# Patient Record
Sex: Female | Born: 1965 | Race: White | Hispanic: No | Smoking: Never smoker
Health system: Southern US, Community
[De-identification: ages and names within clinical notes are randomized; demographics above are authoritative.]

## PROBLEM LIST (undated history)

## (undated) DIAGNOSIS — E079 Disorder of thyroid, unspecified: Secondary | ICD-10-CM

## (undated) DIAGNOSIS — K589 Irritable bowel syndrome without diarrhea: Secondary | ICD-10-CM

## (undated) DIAGNOSIS — T7840XA Allergy, unspecified, initial encounter: Secondary | ICD-10-CM

## (undated) DIAGNOSIS — H348122 Central retinal vein occlusion, left eye, stable: Secondary | ICD-10-CM

## (undated) DIAGNOSIS — N809 Endometriosis, unspecified: Secondary | ICD-10-CM

## (undated) HISTORY — PX: WISDOM TOOTH EXTRACTION: SHX21

## (undated) HISTORY — DX: Disorder of thyroid, unspecified: E07.9

## (undated) HISTORY — DX: Central retinal vein occlusion, left eye, stable: H34.8122

## (undated) HISTORY — DX: Endometriosis, unspecified: N80.9

## (undated) HISTORY — DX: Allergy, unspecified, initial encounter: T78.40XA

## (undated) HISTORY — DX: Irritable bowel syndrome, unspecified: K58.9

---

## 1996-04-05 HISTORY — PX: CERVICAL BIOPSY  W/ LOOP ELECTRODE EXCISION: SUR135

## 1999-08-03 ENCOUNTER — Other Ambulatory Visit: Admission: RE | Admit: 1999-08-03 | Discharge: 1999-08-03 | Payer: Self-pay | Admitting: Gynecology

## 2000-10-03 HISTORY — PX: PELVIC LAPAROSCOPY: SHX162

## 2001-05-09 ENCOUNTER — Other Ambulatory Visit: Admission: RE | Admit: 2001-05-09 | Discharge: 2001-05-09 | Payer: Self-pay | Admitting: Gynecology

## 2002-11-30 ENCOUNTER — Other Ambulatory Visit: Admission: RE | Admit: 2002-11-30 | Discharge: 2002-11-30 | Payer: Self-pay | Admitting: Gynecology

## 2004-06-12 ENCOUNTER — Other Ambulatory Visit: Admission: RE | Admit: 2004-06-12 | Discharge: 2004-06-12 | Payer: Self-pay | Admitting: Gynecology

## 2006-10-12 ENCOUNTER — Other Ambulatory Visit: Admission: RE | Admit: 2006-10-12 | Discharge: 2006-10-12 | Payer: Self-pay | Admitting: Gynecology

## 2006-11-07 ENCOUNTER — Ambulatory Visit (HOSPITAL_BASED_OUTPATIENT_CLINIC_OR_DEPARTMENT_OTHER): Admission: RE | Admit: 2006-11-07 | Discharge: 2006-11-07 | Payer: Self-pay | Admitting: Gynecology

## 2008-05-22 ENCOUNTER — Encounter: Payer: Self-pay | Admitting: Gynecology

## 2008-05-22 ENCOUNTER — Ambulatory Visit: Payer: Self-pay | Admitting: Gynecology

## 2008-05-22 ENCOUNTER — Other Ambulatory Visit: Admission: RE | Admit: 2008-05-22 | Discharge: 2008-05-22 | Payer: Self-pay | Admitting: Gynecology

## 2008-06-11 ENCOUNTER — Encounter: Payer: Self-pay | Admitting: Gynecology

## 2008-06-11 ENCOUNTER — Ambulatory Visit: Payer: Self-pay | Admitting: Gynecology

## 2008-06-17 ENCOUNTER — Ambulatory Visit: Payer: Self-pay | Admitting: Gynecology

## 2008-10-01 ENCOUNTER — Ambulatory Visit: Payer: Self-pay | Admitting: Gynecology

## 2008-10-03 HISTORY — PX: OTHER SURGICAL HISTORY: SHX169

## 2008-10-14 ENCOUNTER — Ambulatory Visit: Payer: Self-pay | Admitting: Gynecology

## 2008-10-18 ENCOUNTER — Ambulatory Visit: Payer: Self-pay | Admitting: Gynecology

## 2008-10-18 ENCOUNTER — Encounter: Payer: Self-pay | Admitting: Gynecology

## 2008-10-18 ENCOUNTER — Ambulatory Visit (HOSPITAL_BASED_OUTPATIENT_CLINIC_OR_DEPARTMENT_OTHER): Admission: RE | Admit: 2008-10-18 | Discharge: 2008-10-19 | Payer: Self-pay | Admitting: Gynecology

## 2008-10-24 ENCOUNTER — Ambulatory Visit: Payer: Self-pay | Admitting: Gynecology

## 2008-11-12 ENCOUNTER — Ambulatory Visit: Payer: Self-pay | Admitting: Gynecology

## 2008-11-26 ENCOUNTER — Ambulatory Visit: Payer: Self-pay | Admitting: Gynecology

## 2009-09-24 ENCOUNTER — Ambulatory Visit: Payer: Self-pay | Admitting: Gynecology

## 2009-09-24 ENCOUNTER — Other Ambulatory Visit: Admission: RE | Admit: 2009-09-24 | Discharge: 2009-09-24 | Payer: Self-pay | Admitting: Gynecology

## 2010-07-12 LAB — CBC
Hemoglobin: 10.4 g/dL — ABNORMAL LOW (ref 12.0–15.0)
MCHC: 33.8 g/dL (ref 30.0–36.0)
MCV: 89.2 fL (ref 78.0–100.0)
RBC: 3.47 MIL/uL — ABNORMAL LOW (ref 3.87–5.11)
WBC: 14.4 10*3/uL — ABNORMAL HIGH (ref 4.0–10.5)

## 2010-07-12 LAB — DIFFERENTIAL
Basophils Relative: 0 % (ref 0–1)
Eosinophils Absolute: 0 10*3/uL (ref 0.0–0.7)
Lymphs Abs: 0.8 10*3/uL (ref 0.7–4.0)
Monocytes Absolute: 0.9 10*3/uL (ref 0.1–1.0)
Monocytes Relative: 6 % (ref 3–12)
Neutro Abs: 12.7 10*3/uL — ABNORMAL HIGH (ref 1.7–7.7)

## 2010-08-18 NOTE — Discharge Summary (Signed)
NAME:  Ashley Arellano, Ashley Arellano                   ACCOUNT NO.:  [id number]   MEDICAL RECORD NO.:  [id number]          PATIENT TYPE:  AMB   LOCATION:  [facility]                         FACILITY:  [facility]   PHYSICIAN:  Timothy P. Fontaine, M.D.DATE OF BIRTH:  1965/12/05   DATE OF ADMISSION:  10/18/2008  DATE OF DISCHARGE:  10/19/2008                               DISCHARGE SUMMARY   DISCHARGE DIAGNOSES:  Menorrhagia, leiomyoma.   PROCEDURE:  Total laparoscopic hysterectomy on October 18, 2008.  Pathology  [facility] [phone] - benign ectocervical and endocervical mucosa, benign  proliferative endometrium, benign myometrium and two intramural  leiomyoma.   HOSPITAL COURSE:  A 45 year old female with worsening menorrhagia.  Ultrasound suggestive of endometrial polyp.  Desired permanent  sterilization for hysterectomy.  The patient underwent an uncomplicated  total laparoscopic hysterectomy on October 18, 2008.  Her postoperative  course, it was noted the a.m. following surgery she was having  postsurgical discomfort, and it was difficult to determine how much was  related to the hysterectomy versus the sling procedure.  After oral pain  medication and ambulation, her pain improved, and ultimately she was  discharged home taking p.o. with adequate pain relief on oral pain  medication.  Her postoperative hemoglobin was stable from her  preoperative value, and she was given postoperative instructions to  include a.s.a.p. call precautions.  She was given a prescription for  pain to include Tylox #25 one to two p.o. q.4-6h. p.r.n. pain.  Her  postoperative bladder care is per the instructions from urology and her  follow up with them for any bladder issues was discussed.  She will  follow up with GYN in 2 weeks following discharge.  Patient was  contacted after discharge and reported feeling much better.      Timothy P. Fontaine, M.D.  Electronically Signed     TPF/MEDQ  D:  10/22/2008  T:  10/22/2008  Job:  [id number]

## 2010-08-18 NOTE — Op Note (Signed)
NAME:  Ashley Arellano, Ashley Arellano                   ACCOUNT NO.:  [id number]   MEDICAL RECORD NO.:  [id number]          PATIENT TYPE:  AMB   LOCATION:  [facility]                         FACILITY:  [facility]   PHYSICIAN:  Timothy P. Fontaine, M.D.DATE OF BIRTH:  02-22-1966   DATE OF PROCEDURE:  10/18/2008  DATE OF DISCHARGE:                               OPERATIVE REPORT   [facility].   PREOPERATIVE DIAGNOSES:  1. Menorrhagia.  2. Leiomyoma.  3. Endometrial polyp.   POSTOPERATIVE DIAGNOSES:  1. Menorrhagia.  2. Leiomyoma.  3. Endometrial polyp.   PROCEDURE:  Total laparoscopic hysterectomy.   SURGEON:  T. Audie Box, M.D.   Threasa HeadsLivia Snellen, M.D.   ANESTHETIC:  General.   ESTIMATED BLOOD LOSS:  100 mL.   COMPLICATIONS:  None.   SPECIMEN:  Uterus to Pathology.   FINDINGS:  EUA:  External, BUS and vagina normal.  Cervix normal,  bimanual uterus grossly normal size, midline and mobile.  Adnexa without  masses.  Laparoscopic:  Anterior cul-de-sac normal.  Posterior cul-de-  sac normal.  Uterus mildly enlarged, midline, mobile, smooth, right and  left ovaries grossly normal free and mobile.  Right and left fallopian  tube segments grossly normal, free and mobile, normal length and  caliber, fimbriated ends.  No evidence of pelvic adhesive disease or  endometriosis.  Upper abdominal exam is grossly normal with no gross  abnormalities or adhesive disease noted.   PROCEDURE:  The patient was taken to the operating room, underwent  general anesthesia, placed in the low dorsal lithotomy position and  received an abdominal perineal vaginal preparation with Betadine scrub  solution.  EUA performed.  Bladder emptied with indwelling Foley  catheter placed in sterile technique.  The cervix was visualized,  grasped with a single-tooth tenaculum, sounded and subsequently a Humi  uterine manipulator with KOH cup and vaginal occlusive insufflator were  all placed without difficulty.  The  uterine catheter was inflated and  noted to be secure.  The patient was then draped in the usual fashion.  A repeat infraumbilical incision was made using the 10-mm OptiVu direct  and trocar.  The abdomen was directly entered under direct visualization  without difficulty and subsequently insufflated.  Right and left 5-mm  suprapubic ports were then placed without difficulty after  transillumination for the vessels under direct visualization.  Examination of the pelvic organs and upper abdominal exam was carried  out with findings noted above.  Using the harmonic scalpel, the left  uterine ovarian pedicle was identified and transected without  difficulty.  The broad ligament peritoneal reflection was then  transected to the level of the round ligament using the harmonic scalpel  and subsequently the round ligament was transected.  The uterine vessel  was then skeletonized, coagulated with the harmonic scalpel with several  passes before transection.  The anterior vesicouterine peritoneal  reflection was then transected to the midline using the harmonic  scalpel.  A similar procedure was then carried out on the other side.  The anterior vesicle uterine plane was then developed with pressure on  the uterine manipulator.  The KOH cup was palpated and the bladder flap  was below this.  Subsequently, using the active blade of the harmonic  scalpel, the posterior peritoneal reflection between the uterosacral  ligaments at the intersection with the uterus was transected until the  blue cup was identified and it is was opened from uterosacral ligament  to uterosacral ligament.  The anterior vaginal mucosa was then  identified again under traction of the KOH cup.  Again, the bladder was  away from the site and, using the active blade, the vaginal mucosa was  transected until the blue of the KOH cup was visualized.  Through  progressive transection, again, using the active blade, the paravaginal   tissues were scored and ultimately transected to completely free the  uterine specimen from the patient.  During this process, the vaginal  insufflator was inflated to maintain the pneumoperitoneum and then  ultimately after freeing of the uterus this was drawn into the vagina  and maintained a pneumoperitoneum.  The pelvis was irrigated.  Hemostasis was visualized using the Endo stitch suturing device.  The  right and left vaginal angle sutures were placed to incorporate the  peritoneum and the vaginal mucosa and then tied using the knot pusher.  The vaginal cuff was then closed through progressive figure-of-eight  sutures again using the Endo stitch incorporating the peritoneum and the  vaginal mucosa and submucosal stroma with care to identify the bladder  and avoid stitches in this area.  The cuff was completely closed and the  pelvis copiously irrigated.  The pressure was slowly allowed to escape.  Hemostasis was visualized and subsequently the right and left suprapubic  ports were removed under direct visualization showing adequate  hemostasis.  Of note, after the freeing of the uterine specimen, the  left 5-mm port was replaced with a 10 mm port to allow the use of the  Endo stitch through this port.  The gas was slowly allowed to escape and  then the 10 mm infraumbilical port was then withdrawn again showing  adequate hemostasis and no evidence of hernia formation.  Zero Vicryl  interrupted subcutaneous fascial stitches were placed in both 10-mm port  sites, 0.25% Marcaine was injected in all skin incisions and all skin  incisions were reapproximated using 4-0 plain suture in interrupted  cutaneous stitch.  At this point, Dr. Roselee Nova assumed care of  the patient and proceeded with his sling urologic procedure.      Timothy P. Fontaine, M.D.  Electronically Signed     TPF/MEDQ  D:  10/18/2008  T:  10/19/2008  Job:  [id number]

## 2010-08-18 NOTE — Op Note (Signed)
NAME:  Ashley Arellano, Ashley Arellano                   ACCOUNT NO.:  [id number]   MEDICAL RECORD NO.:  [id number]          PATIENT TYPE:  AMB   LOCATION:  [facility]                         FACILITY:  [facility]   PHYSICIAN:  Timothy P. Fontaine, M.D.DATE OF BIRTH:  06-11-1965   DATE OF PROCEDURE:  11/07/2006  DATE OF DISCHARGE:                               OPERATIVE REPORT   PREOPERATIVE DIAGNOSES:  Pelvic pain.   POSTOPERATIVE DIAGNOSES:  Right ovarian adhesion.   PROCEDURE:  Diagnostic laparoscopy, lysis of pelvic adhesions.   SURGEON:  Timothy P. Fontaine, M.D.   ANESTHESIA:  General.   ESTIMATED BLOOD LOSS:  Minimal.   COMPLICATIONS:  None.   SPECIMEN:  None.   FINDINGS:  Anterior cul-de-sac normal, posterior cul-de-sac normal,  uterus grossly normal in size, shape and contour, significantly  retroverted, retroflexed into the posterior cul-de-sac.  Right and left  fallopian tubes normal length, caliber, fimbriated ends.  Left ovary  grossly normal free and mobile.  Right ovary adherent to the right  pelvic sidewall, bluntly lysed.  Subsequently freed and grossly normal  in appearance.  No evidence of pelvic endometriosis.  Upper abdominal  exam is normal noting liver smooth, no abnormalities.  Gallbladder  grossly normal to limited inspection, appendix retrocecal not entirely  visualized but distal portion visualized and normal appearance.  EUA  external B U S vagina normal.  Cervix grossly normal.  Bimanual: uterus  retroverted, adnexa without masses.   PROCEDURE:  The patient was taken to the operating room, underwent  general endotracheal anesthesia was placed low dorsal lithotomy  position, received abdominal perineal vaginal preparation Betadine  solution per nursing personnel.  Bladder emptied with in-and-out Foley  catheterization.  EUA was performed.  Initial attempts at placement of  Hulka tenaculum were unsuccessful due to the significant retroversion  and subsequently a Kohen cannula  was placed for uterine manipulation.  The patient was draped in usual fashion.  A vertical infraumbilical  incision was made.  The OptiVu 10 mm laparoscopic port was placed within  the abdomen under direct visualization without difficulty.  The abdomen  was insufflated and subsequently a 5-mm suprapubic midline port was  placed under direct visualization after transillumination for the  vessels.  Examination of pelvic organs, upper abdominal exam was carried  out with noted findings noted above.  On initial entry and inspection of  the pelvis.  The right ovary was found to be adherent to the right  pelvic sidewall.  Through blunt and manipulation to elevate the ovary.  The adhesive band was lysed and the ovary was freed.  That point,  inspection showed a normal pelvis throughout, careful posterior cul-de-  sac inspection showed no evidence of endometriosis.  There was no  bleeding at the lysis point.  This point the suprapubic port was  removed.  The gas slowly allowed to escape.  Adequate hemostasis  visualized under low pressure situation. The infraumbilical port was  then backed out under direct visualization showing adequate hemostasis.  No evidence of hernia formation.  All skin incisions were injected using  0.25% of Marcaine.  A 0-0 Vicryl infraumbilical subcutaneous fascial  stitch was placed.  The suprapubic port closed with Dermabond skin  adhesive.  A small bleeding site at the infraumbilical port was closed  using 4-0 Vicryl in interrupted cutaneous stitch and the remainder of  the skin incision closed using Dermabond skin adhesive.  The Kohen  cannula tenaculum were removed from the cervix.  The patient placed in  the supine position, awakened without difficulty, taken to recovery room  in good condition having tolerated the procedure well.      Timothy P. Fontaine, M.D.  Electronically Signed     TPF/MEDQ  D:  11/07/2006  T:  11/07/2006  Job:  [id number]

## 2010-08-18 NOTE — H&P (Signed)
NAME:  Ashley Arellano, Ashley Arellano                   ACCOUNT NO.:  [id number]   MEDICAL RECORD NO.:  [id number]          PATIENT TYPE:  AMB   LOCATION:  [facility]                         FACILITY:  [facility]   PHYSICIAN:  Timothy P. Fontaine, M.D.DATE OF BIRTH:  [birth year]   DATE OF ADMISSION:  10/18/2008  DATE OF DISCHARGE:                              HISTORY & PHYSICAL   CHIEF COMPLAINT:  Menorrhagia, leiomyoma, endometrial polyp, desires  permanent sterilization, history of stress urinary incontinence.   HISTORY OF PRESENT ILLNESS:  A 45 year old G2, P2 female, history of  worsening menorrhagia, stress urinary incontinence, underwent outpatient  evaluation to include a benign endometrial biopsy and a sonohystogram  which showed multiple small myomas, 2 echogenic defects within the  cavity suggestive of polyps, and an outpatient evaluation by urology  which was consistent with stress urinary incontinence.  Patient is  admitted at this time for definitive treatment and surgery to include  laparoscopic-assisted vaginal hysterectomy and urologic sling procedure.   PAST MEDICAL HISTORY:  Uncomplicated.   PAST SURGICAL HISTORY:  Includes:  1. Laparoscopy for pelvic adhesions.  2. LEEP cervical surgery.  3. Wisdom teeth extraction.   CURRENT MEDICATIONS:  None.   ALLERGIES:  None although does have a strong family history of  penicillin allergy.  Patient has never had exposure to penicillin.   REVIEW OF SYSTEMS:  Noncontributory.   SOCIAL HISTORY:  Noncontributory.   FAMILY HISTORY:  Noncontributory.   ADMISSION PHYSICAL EXAM:  Afebrile.  VITAL SIGNS:  Stable.  HEENT:  Normal.  LUNGS:  Clear.  CARDIAC:  Regular rate.  No rubs, murmurs, or gallops.  ABDOMEN:  Benign.  PELVIC:  External BUS and vagina normal.  Cervix normal.  Uterus normal  size, retroverted, midline, mobile.  Adnexa without masses or  tenderness.   ASSESSMENT:  A 45 year old, G2, P2 female, worsening menorrhagia, normal  thyroid  functions.  Hemoglobin stable.  Outpatient evaluation includes  normal endometrial biopsy and a sonohystogram showing multiple small  myomas and several defects suggestive of a polyp.  Also, has combination  urge stress incontinence and was evaluated by Urology with recommended  sling procedure.  I reviewed with the patient the risks, benefits,  indications, alternatives for the proposed surgery to include more  conservative approaches to include hysteroscopy, removal of the polyps,  and a bilateral tubal ligation simultaneously.  I also reviewed with her  alternatives such as removal of the polyps and subsequent ablation or  attempts at hormonal manipulation.  Endometrial uterine artery  embolization I think is not appropriate given the small size of the  fibroids and lastly attempts at placement of intrauterine device was  also reviewed.  Given her total picture of fibroids, polyps,  menorrhagia, desires of sterilization, history of cervical dysplasia,  certainly think hysterectomy is appropriate and she wants to proceed  with definitive surgery.  I reviewed the issues with hysterectomy to  include absolute irreversible sterility, as well as the issues of  sexuality following hysterectomy from both a persistent dyspareunia, as  well as a persistent orgasmic dysfunctional standpoint and she  understands  and accepts these risks in the future.  The ovarian  conservation issue was reviewed with her and the options of keeping or  removing both ovaries were reviewed.  She understands by keeping both  ovaries she has a risk of ovarian disease in the future such as pain or  benign ovarian disease, cysts, or tumors, as well as the risk of ovarian  cancer.  By removing her ovaries, she understands the issues of  hypoestrogenism both from the symptom standpoint, as well as the issues  of cardiovascular risk, bone health.  Patient desires to keep both  ovaries.  She does give me permission to remove  one or both if  significant disease is encountered but again wants to keep both ovaries  and accepts the risks of ovarian cancer and benign ovarian disease in  the future.  The laparoscopic approach was reviewed with her.  She  understands that if complications arise during the procedure or if  unexpected findings such as significant adhesions that we may need to  convert this to an abdominal approach with a larger incision and she  understands and accepts these possibilities.  The risks of the surgery  were reviewed with her to include risk of infection requiring prolonged  antibiotics, abscess or hematoma formation requiring reoperation,  abscess hematoma drainage, long-term issues with incisions such as  cosmetics, keloids, or hernia formation, or opening and draining of  incisions closure by secondary intention.  The risk of hemorrhage  necessitating transfusion, the risks of transfusion to include  transfusion reaction, hepatitis, HIV, mad cow disease, and other unknown  entities was all discussed, understood, and accepted.  Risk of  inadvertent injury to internal organs including bowel, bladder, ureters,  vessels, and nerves either immediately recognized or delay recognized  necessitating major exploratory surgeries, future reparative surgeries  with bowel resection, bladder repair, ureteral damage repair, ostomy  formation was all discussed, understood, and accepted.  Lastly, we  discussed her bladder surgery.  She understands that Dr. Perley Jain is in  charge of this part of the surgery, that he will be in charge of her  postoperative bladder care, and that any issues or questions referable  to this will need to be directed towards urology.  Patient's questions  were answered to her satisfaction.  She is ready to proceed with  surgery.      Timothy P. Fontaine, M.D.  Electronically Signed     TPF/MEDQ  D:  10/01/2008  T:  10/01/2008  Job:  [id number]

## 2010-08-18 NOTE — Op Note (Signed)
NAME:  Ashley Arellano, Ashley Arellano                   ACCOUNT NO.:  [id number]   MEDICAL RECORD NO.:  [id number]          PATIENT TYPE:  AMB   LOCATION:  [facility]                         FACILITY:  [facility]   PHYSICIAN:  Martina Sinner, MD DATE OF BIRTH:  1965-09-04   DATE OF PROCEDURE:  10/18/2008  DATE OF DISCHARGE:                               OPERATIVE REPORT   PREOPERATIVE DIAGNOSIS:  Stress incontinence.   POSTOPERATIVE DIAGNOSIS:  Stress incontinence.   PROCEDURE:  Sling, cystourethropexy [facility]) plus cystoscopy.   PROCEDURE IN DETAIL:  [name] has stress incontinence.  She had a  laparoscopic hysterectomy.  Her bladder support was good when she came  in for the surgery.  With the bladder empty I made two 1 cm incisions  one fingerbreadth above the symphysis pubis and 1.5 cm lateral to the  midline.  I made a 2 cm incision underlying the mid urethra at  appropriate depth after instilling 6 mL of lidocaine/epinephrine  mixture.  I bluntly dissected the urethrovesical angle bilaterally.  With the bladder empty I passed a SPARC needle on top along the back of  the symphysis pubis with the palm and index finger bilaterally.   I cystoscoped the patient.  There was no injury to the bladder or  urethra.  There was no indentation with movement of the trocar.  There  were excellent blue jets bilaterally.   With the bladder empty I passed I attached the SPARC sling and brought  it up to the retropubic space.  I cut below the blue dots and removed  the sheath and I tensioned the sling over the fat part of a mid sized  Kelly clamp.  I was happy with the position and tension of the sling  with no spring back.   Vicryl 2-0 on a CT1 needle was used for the vaginal wall followed by two  interrupted sutures.  I cut the sling below the skin and used a 4-0  Vicryl followed by Dermabond.  I was very pleased with the surgery.  Hopefully she will her treatment goal.           ______________________________  Martina Sinner, MD  Electronically Signed     SAM/MEDQ  D:  10/18/2008  T:  10/19/2008  Job:  [id number]

## 2010-08-18 NOTE — H&P (Signed)
NAME:  Ashley Arellano, Ashley Arellano                   ACCOUNT NO.:  [id number]   MEDICAL RECORD NO.:  [id number]          PATIENT TYPE:  AMB   LOCATION:  [facility]                         FACILITY:  [facility]   PHYSICIAN:  Timothy P. Fontaine, M.D.DATE OF BIRTH:  1965/11/06   DATE OF ADMISSION:  11/07/2006  DATE OF DISCHARGE:                              HISTORY & PHYSICAL   CHIEF COMPLAINT:  Right-sided pelvic pain.   HISTORY OF PRESENT ILLNESS:  A 45 year old G2, P2 female, withdrawal  contraception, describes right-sided pelvic pain progressively getting  worse over the past several months now almost on a daily basis, nagging  to sharp stabbing with movement.  She also reports deep dyspareunia on  the right, throbbing aching pain in the same region.  She apparently was  clinically diagnosed with endometriosis years ago.  She was put on a  course of Lupron and the pain improved, although she never had a  laparoscopic diagnoses.  She had been on trials oral contraceptives but  could not tolerate these due to nausea and vomiting.  Outpatient  evaluation included ultrasonography which was normal with exception of a  small 20 mm myoma.  She is admitted at this time for a diagnostic  laparoscopy.   PAST SURGICAL HISTORY:  Includes LEEP biopsy of the cervix and wisdom  teeth extraction.   PAST MEDICAL HISTORY:  None.   ALLERGIES:  No medications.   REVIEW OF SYSTEMS:  Noncontributory.   FAMILY HISTORY:  Noncontributory.   SOCIAL HISTORY:  Noncontributory.   ADMISSION PHYSICAL EXAMINATION:  VITAL SIGNS:  Afebrile, vital signs  stable.  HEENT: Normal.  LUNGS:  Clear.  CARDIAC:  Regular rate, no rubs, murmurs or gallops.  ABDOMINAL:  Exam benign.  PELVIC:  External BUS, vagina grossly normal.  Cervix normal.  Uterus  normal size, shape, contour, midline immobile.  Adnexa without gross  masses.  Some mild tenderness on right deep palpation.   ASSESSMENT/PLAN:  A 45 year old G2, P2 female, right-sided  pelvic pain,  worsening now with right-sided dyspareunia for diagnostic laparoscopy.   I reviewed the proposed surgery with the patient, the expected  intraoperative and postoperative courses, the acute intraoperative and  postoperative risks.  She understands there are no guarantees as far as  pain relief, that her pain may persist, worsen or change following the  procedure.  Various pathology possibilities were reviewed to include  endometriosis, adhesive disease or no pathology found.  I reviewed with  her what is involved with the procedure, including general anesthesia,  multiple port sites, trocar placement, insufflation, sharp blunt  dissection, electrocautery in the use of laser.  The risks of infection  both internal requiring prolonged antibiotics, abscess formation  requiring opening and draining of abscesses, as well as skin  complications requiring opening and draining of incisions, closure by  secondary intention, and long-term risks of hernia formation within the  incision sites all discussed, understood, accepted.  The risks of  bleeding leading to transfusion and risks of transfusion discussed to  include transfusion reaction, hepatitis, HIV, mad cow disease and other  unknown entities.  The risks of internal organ damage both immediately  recognized and delay recognized postsurgical to include bowel, bladder,  ureters, vessels and nerves, necessitating major exploratory reparative  surgeries, future reparative surgeries, bowel resection, ostomy  formation was all discussed, understood and accepted.  The patient's  questions were answered to her satisfaction.  She is ready to proceed  with surgery      Timothy P. Fontaine, M.D.  Electronically Signed     TPF/MEDQ  D:  11/02/2006  T:  11/03/2006  Job:  [id number]

## 2010-12-24 DIAGNOSIS — N643 Galactorrhea not associated with childbirth: Secondary | ICD-10-CM | POA: Insufficient documentation

## 2010-12-29 ENCOUNTER — Ambulatory Visit (INDEPENDENT_AMBULATORY_CARE_PROVIDER_SITE_OTHER): Payer: PRIVATE HEALTH INSURANCE | Admitting: Gynecology

## 2010-12-29 ENCOUNTER — Encounter: Payer: Self-pay | Admitting: Gynecology

## 2010-12-29 VITALS — BP 130/70 | Ht 66.0 in | Wt 160.0 lb

## 2010-12-29 DIAGNOSIS — Z131 Encounter for screening for diabetes mellitus: Secondary | ICD-10-CM

## 2010-12-29 DIAGNOSIS — N643 Galactorrhea not associated with childbirth: Secondary | ICD-10-CM

## 2010-12-29 DIAGNOSIS — Z8349 Family history of other endocrine, nutritional and metabolic diseases: Secondary | ICD-10-CM

## 2010-12-29 DIAGNOSIS — Z13220 Encounter for screening for lipoid disorders: Secondary | ICD-10-CM

## 2010-12-29 DIAGNOSIS — R82998 Other abnormal findings in urine: Secondary | ICD-10-CM

## 2010-12-29 DIAGNOSIS — Z01419 Encounter for gynecological examination (general) (routine) without abnormal findings: Secondary | ICD-10-CM

## 2010-12-29 NOTE — Patient Instructions (Signed)
Follow up for mammogram ultrasound

## 2010-12-29 NOTE — Progress Notes (Signed)
Ashley Arellano [birth year] [id number]        45 y.o.  for annual exam.  Overall doing well but does note some tenderness in her upper outer breast quadrant going into her axilla. She's had this in the past it comes and goes she has no palpable masses or other issues. She does have a history of galactorrhea number of years ago but no recent galactorrhea. She is status post TLH sling procedure July 2010 for leiomyomata, menorrhagia and incontinence.  Past medical history,surgical history, medications, allergies, family history and social history were all reviewed and documented in the EPIC chart. ROS:  Was performed and pertinent positives and negatives are included in the history.  Exam: chaperone present Filed Vitals:   12/29/10 1420  BP: 130/70   General appearance  Normal Skin grossly normal Head/Neck normal with no cervical or supraclavicular adenopathy thyroid normal Lungs  clear Cardiac RR, without RMG Abdominal  soft, nontender, without masses, organomegaly or hernia Breasts  examined lying and sitting without masses, retractions or axillary adenopathy.  Bilateral galactorrhea noted with expression. Area of discomfort is in the upper outer left tail of Spence axillary region but there is no palpable or visual abnormalities Pelvic  Ext/BUS/vagina  normal Pap not done  Adnexa  Without masses or tenderness    Anus and perineum  normal   Rectovaginal  normal sphincter tone without palpated masses or tenderness.    Assessment/Plan:  45 y.o. female for annual exam.    1. Bilateral galactorrhea left breast tenderness. We'll check baseline prolactin. The upper outer quadrant on the left which she is pointing to has no palpable or visual abnormalities. We'll start with diagnostic mammogram and ultrasound of this area. It does come and go I suspect it is physiologic but we'll make sure with mammogram and ultrasound. I gave her a request slip to schedule at [facility] she is going to do  herself and she knows importance of followup. 2. Family history of hypothyroidism. She also has had some recent weight gain and I went ahead and checked a TSH at her request I suspect is more dietary exercise related. 3. Health maintenance. Self breast exams on a monthly basis discussed with her she'll get her breast studies as noted above. I did not do a Pap smear this year. I discussed current screening recommendations with her and she has had annual regular Pap smears which have all been negative and no history of significant Pap smear atypia in the past. I reviewed with her that we could stop doing Pap smears according to the new recommendations since she is status post hysterectomy but then again we will rediscuss this on an annual basis. Baseline CBC glucose lipid profile urinalysis was also done as well as her TSH and prolactin.    [name] MD, 3:02 PM 12/29/2010

## 2010-12-31 ENCOUNTER — Other Ambulatory Visit: Payer: Self-pay | Admitting: Gynecology

## 2010-12-31 DIAGNOSIS — N390 Urinary tract infection, site not specified: Secondary | ICD-10-CM

## 2010-12-31 MED ORDER — SULFAMETHOXAZOLE-TMP DS 800-160 MG PO TABS: 1.0000 | ORAL_TABLET | Freq: Two times a day (BID) | ORAL | Status: AC

## 2011-01-01 ENCOUNTER — Encounter: Payer: Self-pay | Admitting: Gynecology

## 2011-12-31 ENCOUNTER — Ambulatory Visit (INDEPENDENT_AMBULATORY_CARE_PROVIDER_SITE_OTHER): Payer: PRIVATE HEALTH INSURANCE | Admitting: Gynecology

## 2011-12-31 ENCOUNTER — Encounter: Payer: Self-pay | Admitting: Gynecology

## 2011-12-31 VITALS — BP 136/94 | Ht 65.0 in | Wt 160.0 lb

## 2011-12-31 DIAGNOSIS — R21 Rash and other nonspecific skin eruption: Secondary | ICD-10-CM

## 2011-12-31 DIAGNOSIS — Z01419 Encounter for gynecological examination (general) (routine) without abnormal findings: Secondary | ICD-10-CM

## 2011-12-31 DIAGNOSIS — N643 Galactorrhea not associated with childbirth: Secondary | ICD-10-CM

## 2011-12-31 DIAGNOSIS — Z13220 Encounter for screening for lipoid disorders: Secondary | ICD-10-CM

## 2011-12-31 LAB — CBC WITH DIFFERENTIAL/PLATELET
Basophils Relative: 0 % (ref 0–1)
Eosinophils Absolute: 0.2 10*3/uL (ref 0.0–0.7)
Eosinophils Relative: 2 % (ref 0–5)
Hemoglobin: 13.6 g/dL (ref 12.0–15.0)
Lymphs Abs: 2 10*3/uL (ref 0.7–4.0)
MCH: 29.8 pg (ref 26.0–34.0)
MCHC: 34.1 g/dL (ref 30.0–36.0)
MCV: 87.5 fL (ref 78.0–100.0)
Monocytes Relative: 7 % (ref 3–12)
Neutrophils Relative %: 67 % (ref 43–77)
RBC: 4.56 MIL/uL (ref 3.87–5.11)

## 2011-12-31 LAB — COMPREHENSIVE METABOLIC PANEL
ALT: 17 U/L (ref 0–35)
Alkaline Phosphatase: 52 U/L (ref 39–117)
CO2: 24 mEq/L (ref 19–32)
Creat: 0.81 mg/dL (ref 0.50–1.10)
Sodium: 137 mEq/L (ref 135–145)
Total Bilirubin: 0.3 mg/dL (ref 0.3–1.2)
Total Protein: 7.3 g/dL (ref 6.0–8.3)

## 2011-12-31 LAB — SEDIMENTATION RATE: Sed Rate: 5 mm/hr (ref 0–22)

## 2011-12-31 LAB — LIPID PANEL
Cholesterol: 192 mg/dL (ref 0–200)
LDL Cholesterol: 106 mg/dL — ABNORMAL HIGH (ref 0–99)
Total CHOL/HDL Ratio: 2.8 Ratio
VLDL: 17 mg/dL (ref 0–40)

## 2011-12-31 NOTE — Progress Notes (Signed)
Samari Bittinger 1966-03-05 [id number]        46 y.o.  G2P2 for annual exam.  Several issues noted below.  Past medical history,surgical history, medications, allergies, family history and social history were all reviewed and documented in the EPIC chart. ROS:  Was performed and pertinent positives and negatives are included in the history.  Exam: Fleet Contras assistant Filed Vitals:   12/31/11 1516  BP: 136/94  Height: 5\' 5"  (1.651 m)  Weight: 160 lb (72.576 kg)   General appearance  Normal  Skin small erythematous papules with whitish head with varying degrees of resolution to scabbing over to hyperpigmented patches covering arms legs abdomen.   Head/Neck normal with no cervical or supraclavicular adenopathy thyroid normal Lungs  clear Cardiac RR, without RMG Abdominal  soft, nontender, without masses, organomegaly or hernia Breasts  examined lying and sitting without masses, retractions, discharge or axillary adenopathy. Pelvic  Ext/BUS/vagina  normal   Adnexa  Without masses or tenderness    Anus and perineum  normal   Rectovaginal  normal sphincter tone without palpated masses or tenderness.    Assessment/Plan:  46 y.o. G2P2 female for annual exam.   1. Status post TLH/sling for incontinence/leiomyoma/menorrhagia 2010. Patient doing well we'll continue to monitor. 2. History of galactorrhea with mammography/ultrasound showing multiple small breast cysts and benign-appearing lymph node last year.  Needs mammogram now she knows to schedule. SBE monthly reviewed. Prolactin last year 22, recheck level today. No galactorrhea on exam today. 3. Skin rash. Present for months with an evolving pattern of acute inflammatory type papules 2 crusting over areas to hyperpigmented resolving spots. Covers most of her body. Questionable vasculitis. Recommend dermatology appointment. We'll check ANA/sedimentation rate. 4. Pap smear. History of LEEP in 1998 elsewhere. 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 Her Pap smears have been negative since then. Last Pap smear 2011. Discussed current screening guidelines and the options to stop screening altogether since she is status post hysterectomy for benign indications noting her cervix was normal on pathology at her hysterectomy specimen versus less frequent screening and we'll readdress annually. 5. Health maintenance.  Will check baseline CBC comprehensive metabolic panel lipid profile and urinalysis with above lab work. Follow up for results, annually for exam.    [name] MD, 4:57 PM 12/31/2011

## 2011-12-31 NOTE — Patient Instructions (Addendum)
Office will call you to arrange dermatology appt Schedule mammogram

## 2012-01-01 LAB — URINALYSIS W MICROSCOPIC + REFLEX CULTURE
Bacteria, UA: NONE SEEN
Bilirubin Urine: NEGATIVE
Casts: NONE SEEN
Crystals: NONE SEEN
Glucose, UA: NEGATIVE mg/dL
Hgb urine dipstick: NEGATIVE
Ketones, ur: NEGATIVE mg/dL
Specific Gravity, Urine: 1.022 (ref 1.005–1.030)
pH: 5.5 (ref 5.0–8.0)

## 2012-01-01 LAB — PROLACTIN: Prolactin: 17.2 ng/mL

## 2012-01-03 ENCOUNTER — Encounter: Payer: Self-pay | Admitting: Gynecology

## 2012-01-03 LAB — ANTI-NUCLEAR AB-TITER (ANA TITER): ANA Titer 1: 1:40 {titer} — ABNORMAL HIGH

## 2012-01-04 ENCOUNTER — Telehealth: Payer: Self-pay | Admitting: *Deleted

## 2012-01-04 DIAGNOSIS — R21 Rash and other nonspecific skin eruption: Secondary | ICD-10-CM

## 2012-01-04 NOTE — Telephone Encounter (Signed)
Message copied by Aura Camps on Tue Jan 04, 2012  9:25 AM ------      Message from: [name]      Created: Fri Dec 31, 2011  5:07 PM       Schedule dermatology appointment for patient, as soon as reasonable reference active migratory erythematous papule rash

## 2012-01-04 NOTE — Telephone Encounter (Signed)
Appointment with Dr.Hall on 01/14/12 9:30 am. Pt informed.

## 2012-01-04 NOTE — Telephone Encounter (Signed)
Office notes faxed to Dr.Hall office.

## 2012-05-20 ENCOUNTER — Other Ambulatory Visit: Payer: Self-pay

## 2013-01-01 ENCOUNTER — Other Ambulatory Visit (HOSPITAL_COMMUNITY)
Admission: RE | Admit: 2013-01-01 | Discharge: 2013-01-01 | Disposition: A | Discharge status: Home / Self Care | Payer: PRIVATE HEALTH INSURANCE | Source: Ambulatory Visit | Attending: Gynecology | Admitting: Gynecology

## 2013-01-01 ENCOUNTER — Ambulatory Visit (INDEPENDENT_AMBULATORY_CARE_PROVIDER_SITE_OTHER): Payer: PRIVATE HEALTH INSURANCE | Admitting: Gynecology

## 2013-01-01 ENCOUNTER — Encounter: Payer: Self-pay | Admitting: Gynecology

## 2013-01-01 VITALS — BP 110/70 | Ht 66.0 in | Wt 162.0 lb

## 2013-01-01 DIAGNOSIS — R768 Other specified abnormal immunological findings in serum: Secondary | ICD-10-CM

## 2013-01-01 DIAGNOSIS — N643 Galactorrhea not associated with childbirth: Secondary | ICD-10-CM

## 2013-01-01 DIAGNOSIS — R300 Dysuria: Secondary | ICD-10-CM

## 2013-01-01 DIAGNOSIS — Z01419 Encounter for gynecological examination (general) (routine) without abnormal findings: Secondary | ICD-10-CM

## 2013-01-01 DIAGNOSIS — R894 Abnormal immunological findings in specimens from other organs, systems and tissues: Secondary | ICD-10-CM

## 2013-01-01 LAB — CBC WITH DIFFERENTIAL/PLATELET
Basophils Relative: 0 % (ref 0–1)
Eosinophils Absolute: 0.2 10*3/uL (ref 0.0–0.7)
Eosinophils Relative: 3 % (ref 0–5)
Hemoglobin: 13.3 g/dL (ref 12.0–15.0)
Lymphs Abs: 1.6 10*3/uL (ref 0.7–4.0)
MCH: 30.4 pg (ref 26.0–34.0)
MCHC: 34.7 g/dL (ref 30.0–36.0)
MCV: 87.4 fL (ref 78.0–100.0)
Monocytes Relative: 8 % (ref 3–12)
Platelets: 321 10*3/uL (ref 150–400)
RBC: 4.38 MIL/uL (ref 3.87–5.11)

## 2013-01-01 LAB — LIPID PANEL
Cholesterol: 199 mg/dL (ref 0–200)
HDL: 61 mg/dL (ref >39)
Total CHOL/HDL Ratio: 3.3 Ratio
Triglycerides: 70 mg/dL (ref <150)

## 2013-01-01 LAB — COMPREHENSIVE METABOLIC PANEL
BUN: 12 mg/dL (ref 6–23)
CO2: 27 mEq/L (ref 19–32)
Calcium: 9.4 mg/dL (ref 8.4–10.5)
Chloride: 104 mEq/L (ref 96–112)
Creat: 0.75 mg/dL (ref 0.50–1.10)
Total Bilirubin: 0.5 mg/dL (ref 0.3–1.2)

## 2013-01-01 NOTE — Patient Instructions (Signed)
Followup for annual exam in one year, sooner if any issues.   Call to Schedule your mammogram  Facilities in [place]: 1)  [facility] [place], [place] Phone: [phone] 2)  [facility], [phone] Phone: [phone] 3)  Dr. Yolanda Bonine at [facility] [address] Phone: [phone]     Mammogram A mammogram is an X-ray test to find changes in a woman's breast. You should get a mammogram if:  You are 7 years of age or older  You have risk factors.   Your doctor recommends that you have one.  BEFORE THE TEST  Do not schedule the test the week before your period, especially if your breasts are sore during this time.  On the day of your mammogram:  Wash your breasts and armpits well. After washing, do not put on any deodorant or talcum powder on until after your test.   Eat and drink as you usually do.   Take your medicines as usual.   If you are diabetic and take insulin, make sure you:   Eat before coming for your test.   Take your insulin as usual.   If you cannot keep your appointment, call before the appointment to cancel. Schedule another appointment.  TEST  You will need to undress from the waist up. You will put on a hospital gown.   Your breast will be put on the mammogram machine, and it will press firmly on your breast with a piece of plastic called a compression paddle. This will make your breast flatter so that the machine can X-ray all parts of your breast.   Both breasts will be X-rayed. Each breast will be X-rayed from above and from the side. An X-ray might need to be taken again if the picture is not good enough.   The mammogram will last about 15 to 30 minutes.  AFTER THE TEST Finding out the results of your test Ask when your test results will be ready. Make sure you get your test results.  Document Released: 06/18/2008 Document Revised:  03/11/2011 Document Reviewed: 06/18/2008 [facility] Patient Information 2012 [place], [place].

## 2013-01-01 NOTE — Progress Notes (Signed)
Callee Rohrig 09-06-1965 [id number]        47 y.o.  [id number] for annual exam.  Several issues noted below.  Past medical history,surgical history, medications, allergies, family history and social history were all reviewed and documented in the EPIC chart.  ROS:  Performed and pertinent positives and negatives are included in the history, assessment and plan .  Exam: Kim assistant Filed Vitals:   01/01/13 0831  BP: 110/70  Height: 5\' 6"  (1.676 m)  Weight: 162 lb (73.483 kg)   General appearance  Normal Skin grossly normal Head/Neck normal with no cervical or supraclavicular adenopathy thyroid normal Lungs  clear Cardiac RR, without RMG Abdominal  soft, nontender, without masses, organomegaly or hernia Breasts  examined lying and sitting without masses, retractions, discharge or axillary adenopathy. Whitish galactorrhea right breast. No galactorrhea left breast Pelvic  Ext/BUS/vagina  normal  Adnexa  Without masses or tenderness    Anus and perineum  normal   Rectovaginal  normal sphincter tone without palpated masses or tenderness.    Assessment/Plan:  47 y.o. [id number] female for annual exam.   1. Status post TLH/sling for incontinence/leiomyoma/menorrhagia 2010. Patient doing well and we will continue to monitor. 2. Dysuria. At the last day or so she's noted some midstream dysuria. No fever chills low back pain urgency or frequency. No vaginal discharge itching or odor. Check UA today. 3. History of galactorrhea. Greenish to whitish. Never bloody. Prolactin 17 last year. Recheck prolactin now. Patient is to report if it's ever bloody. Not bad enough the patient wants to consider taking medication to suppress. Patient overdue for mammogram and I strongly recommended that she schedule and she agrees to do so. SBE monthly review. 4. Positive ANA last year 1:40 with speckled pattern. Repeat now. Had rash last year that was felt to be infection by dermatology and had resolved with their  treatment 5. Pap smear. History of LEEP in 1998 elsewhere. 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 Her Pap smears have been negative since then. Last Pap smear 2011. Discussed current screening guidelines and the options to stop screening altogether since she is status post hysterectomy for benign indications noting her cervix was normal on pathology at her hysterectomy specimen versus less frequent screening and we'll readdress annually. Pap was done today. 6. Health maintenance.  Will check baseline CBC comprehensive metabolic panel lipid profile and urinalysis with above lab work. Follow up for results, annually for exam.   Note: This document was prepared with digital dictation and possible smart phrase technology. Any transcriptional errors that result from this process are unintentional.   [name] MD, 9:02 AM 01/01/2013

## 2013-01-01 NOTE — Addendum Note (Signed)
Addended by: Dayna Barker on: 01/01/2013 09:09 AM   Modules accepted: Orders

## 2013-01-02 LAB — URINALYSIS W MICROSCOPIC + REFLEX CULTURE
Bilirubin Urine: NEGATIVE
Glucose, UA: NEGATIVE mg/dL
Hgb urine dipstick: NEGATIVE
Ketones, ur: NEGATIVE mg/dL
Protein, ur: NEGATIVE mg/dL

## 2013-01-02 LAB — PROLACTIN: Prolactin: 12.5 ng/mL

## 2013-01-03 ENCOUNTER — Other Ambulatory Visit: Payer: Self-pay | Admitting: Gynecology

## 2013-01-03 MED ORDER — SULFAMETHOXAZOLE-TMP DS 800-160 MG PO TABS: 1.0000 | ORAL_TABLET | Freq: Two times a day (BID) | ORAL | Status: DC

## 2013-01-04 LAB — URINE CULTURE: Colony Count: >=100000

## 2013-02-08 ENCOUNTER — Other Ambulatory Visit: Payer: Self-pay

## 2013-04-05 DIAGNOSIS — H348122 Central retinal vein occlusion, left eye, stable: Secondary | ICD-10-CM

## 2013-04-05 HISTORY — DX: Central retinal vein occlusion, left eye, stable: H34.8122

## 2014-01-02 ENCOUNTER — Encounter: Payer: Self-pay | Admitting: Gynecology

## 2014-01-02 ENCOUNTER — Ambulatory Visit (INDEPENDENT_AMBULATORY_CARE_PROVIDER_SITE_OTHER): Payer: PRIVATE HEALTH INSURANCE | Admitting: Gynecology

## 2014-01-02 VITALS — BP 110/78 | Ht 67.0 in | Wt 168.0 lb

## 2014-01-02 DIAGNOSIS — J208 Acute bronchitis due to other specified organisms: Secondary | ICD-10-CM

## 2014-01-02 DIAGNOSIS — Z01419 Encounter for gynecological examination (general) (routine) without abnormal findings: Secondary | ICD-10-CM

## 2014-01-02 DIAGNOSIS — J209 Acute bronchitis, unspecified: Secondary | ICD-10-CM

## 2014-01-02 LAB — CBC WITH DIFFERENTIAL/PLATELET
BASOS PCT: 0 % (ref 0–1)
Basophils Absolute: 0 10*3/uL (ref 0.0–0.1)
EOS ABS: 0.3 10*3/uL (ref 0.0–0.7)
Eosinophils Relative: 3 % (ref 0–5)
HCT: 40.2 % (ref 36.0–46.0)
HEMOGLOBIN: 13.7 g/dL (ref 12.0–15.0)
Lymphocytes Relative: 17 % (ref 12–46)
Lymphs Abs: 1.6 10*3/uL (ref 0.7–4.0)
MCH: 29.9 pg (ref 26.0–34.0)
MCHC: 34.1 g/dL (ref 30.0–36.0)
MCV: 87.8 fL (ref 78.0–100.0)
MONOS PCT: 8 % (ref 3–12)
Monocytes Absolute: 0.7 10*3/uL (ref 0.1–1.0)
NEUTROS ABS: 6.7 10*3/uL (ref 1.7–7.7)
NEUTROS PCT: 72 % (ref 43–77)
PLATELETS: 347 10*3/uL (ref 150–400)
RBC: 4.58 MIL/uL (ref 3.87–5.11)
RDW: 13.3 % (ref 11.5–15.5)
WBC: 9.3 10*3/uL (ref 4.0–10.5)

## 2014-01-02 LAB — COMPREHENSIVE METABOLIC PANEL
ALBUMIN: 4.1 g/dL (ref 3.5–5.2)
ALK PHOS: 69 U/L (ref 39–117)
ALT: 23 U/L (ref 0–35)
AST: 20 U/L (ref 0–37)
BILIRUBIN TOTAL: 0.4 mg/dL (ref 0.2–1.2)
BUN: 11 mg/dL (ref 6–23)
CO2: 24 mEq/L (ref 19–32)
Calcium: 9.8 mg/dL (ref 8.4–10.5)
Chloride: 98 mEq/L (ref 96–112)
Creat: 0.77 mg/dL (ref 0.50–1.10)
GLUCOSE: 91 mg/dL (ref 70–99)
POTASSIUM: 4.9 meq/L (ref 3.5–5.3)
SODIUM: 137 meq/L (ref 135–145)
TOTAL PROTEIN: 7.4 g/dL (ref 6.0–8.3)

## 2014-01-02 LAB — TSH: TSH: 2.813 u[IU]/mL (ref 0.350–4.500)

## 2014-01-02 LAB — LIPID PANEL
CHOL/HDL RATIO: 3.2 ratio
Cholesterol: 185 mg/dL (ref 0–200)
HDL: 58 mg/dL (ref >39)
LDL CALC: 105 mg/dL — AB (ref 0–99)
Triglycerides: 110 mg/dL (ref <150)
VLDL: 22 mg/dL (ref 0–40)

## 2014-01-02 MED ORDER — DOXYCYCLINE HYCLATE 100 MG PO TABS: 100.0000 mg | ORAL_TABLET | Freq: Two times a day (BID) | ORAL | Status: DC

## 2014-01-02 NOTE — Patient Instructions (Signed)
Take the doxycycline antibiotic twice daily for 7 days.  Call to Schedule your mammogram  Facilities in [place]: 1)  [facility], [place] Phone: [phone] 2)  [facility] [address] [employer] [place] Phone: [phone] 3)  Dr. Isaiah Blakes at [facility] [address] Phone: [phone]     Mammogram A mammogram is an X-ray test to find changes in a woman's breast. You should get a mammogram if:  You are 79 years of age or older  You have risk factors.   Your doctor recommends that you have one.  BEFORE THE TEST  Do not schedule the test the week before your period, especially if your breasts are sore during this time.  On the day of your mammogram:  Wash your breasts and armpits well. After washing, do not put on any deodorant or talcum powder on until after your test.   Eat and drink as you usually do.   Take your medicines as usual.   If you are diabetic and take insulin, make sure you:   Eat before coming for your test.   Take your insulin as usual.   If you cannot keep your appointment, call before the appointment to cancel. Schedule another appointment.  TEST  You will need to undress from the waist up. You will put on a hospital gown.   Your breast will be put on the mammogram machine, and it will press firmly on your breast with a piece of plastic called a compression paddle. This will make your breast flatter so that the machine can X-ray all parts of your breast.   Both breasts will be X-rayed. Each breast will be X-rayed from above and from the side. An X-ray might need to be taken again if the picture is not good enough.   The mammogram will last about 15 to 30 minutes.  AFTER THE TEST Finding out the results of your test Ask when your test results will be ready. Make sure you get your test results.  Document Released: 06/18/2008 Document Revised:  03/11/2011 Document Reviewed: 06/18/2008 [facility] Patient Information 2012 [place].  You may obtain a copy of any labs that were done today by logging onto MyChart as outlined in the instructions provided with your AVS (after visit summary). The office will not call with normal lab results but certainly if there are any significant abnormalities then we will contact you.   Health Maintenance, Female A healthy lifestyle and preventative care can promote health and wellness.  Maintain regular health, dental, and eye exams.  Eat a healthy diet. Foods like vegetables, fruits, whole grains, low-fat dairy products, and lean protein foods contain the nutrients you need without too many calories. Decrease your intake of foods high in solid fats, added sugars, and salt. Get information about a proper diet from your caregiver, if necessary.  Regular physical exercise is one of the most important things you can do for your health. Most adults should get at least 150 minutes of moderate-intensity exercise (any activity that increases your heart rate and causes you to sweat) each week. In addition, most adults need muscle-strengthening exercises on 2 or more days a week.   Maintain a healthy weight. The body mass index (BMI) is a screening tool to identify possible weight problems. It provides an estimate of body fat based on height and weight. Your caregiver can help determine your BMI, and can  help you achieve or maintain a healthy weight. For adults 20 years and older:  A BMI below 18.5 is considered underweight.  A BMI of 18.5 to 24.9 is normal.  A BMI of 25 to 29.9 is considered overweight.  A BMI of 30 and above is considered obese.  Maintain normal blood lipids and cholesterol by exercising and minimizing your intake of saturated fat. Eat a balanced diet with plenty of fruits and vegetables. Blood tests for lipids and cholesterol should begin at age 57 and be repeated every 5 years. If  your lipid or cholesterol levels are high, you are over 50, or you are a high risk for heart disease, you may need your cholesterol levels checked more frequently.Ongoing high lipid and cholesterol levels should be treated with medicines if diet and exercise are not effective.  If you smoke, find out from your caregiver how to quit. If you do not use tobacco, do not start.  Lung cancer screening is recommended for adults aged 33 80 years who are at high risk for developing lung cancer because of a history of smoking. Yearly low-dose computed tomography (CT) is recommended for people who have at least a 30-pack-year history of smoking and are a current smoker or have quit within the past 15 years. A pack year of smoking is smoking an average of 1 pack of cigarettes a day for 1 year (for example: 1 pack a day for 30 years or 2 packs a day for 15 years). Yearly screening should continue until the smoker has stopped smoking for at least 15 years. Yearly screening should also be stopped for people who develop a health problem that would prevent them from having lung cancer treatment.  If you are pregnant, do not drink alcohol. If you are breastfeeding, be very cautious about drinking alcohol. If you are not pregnant and choose to drink alcohol, do not exceed 1 drink per day. One drink is considered to be 12 ounces (355 mL) of beer, 5 ounces (148 mL) of wine, or 1.5 ounces (44 mL) of liquor.  Avoid use of street drugs. Do not share needles with anyone. Ask for help if you need support or instructions about stopping the use of drugs.  High blood pressure causes heart disease and increases the risk of stroke. Blood pressure should be checked at least every 1 to 2 years. Ongoing high blood pressure should be treated with medicines, if weight loss and exercise are not effective.  If you are 36 to 48 years old, ask your caregiver if you should take aspirin to prevent strokes.  Diabetes screening involves taking  a blood sample to check your fasting blood sugar level. This should be done once every 3 years, after age 5, if you are within normal weight and without risk factors for diabetes. Testing should be considered at a younger age or be carried out more frequently if you are overweight and have at least 1 risk factor for diabetes.  Breast cancer screening is essential preventative care for women. You should practice "breast self-awareness." This means understanding the normal appearance and feel of your breasts and may include breast self-examination. Any changes detected, no matter how small, should be reported to a caregiver. Women in their 35s and 30s should have a clinical breast exam (CBE) by a caregiver as part of a regular health exam every 1 to 3 years. After age 7, women should have a CBE every year. Starting at age 37, women should consider having  a mammogram (breast X-ray) every year. Women who have a family history of breast cancer should talk to their caregiver about genetic screening. Women at a high risk of breast cancer should talk to their caregiver about having an MRI and a mammogram every year.  Breast cancer gene (BRCA)-related cancer risk assessment is recommended for women who have family members with BRCA-related cancers. BRCA-related cancers include breast, ovarian, tubal, and peritoneal cancers. Having family members with these cancers may be associated with an increased risk for harmful changes (mutations) in the breast cancer genes BRCA1 and BRCA2. Results of the assessment will determine the need for genetic counseling and BRCA1 and BRCA2 testing.  The Pap test is a screening test for cervical cancer. Women should have a Pap test starting at age 5. Between ages 42 and 50, Pap tests should be repeated every 2 years. Beginning at age 58, you should have a Pap test every 3 years as long as the past 3 Pap tests have been normal. If you had a hysterectomy for a problem that was not cancer  or a condition that could lead to cancer, then you no longer need Pap tests. If you are between ages 48 and 30, and you have had normal Pap tests going back 10 years, you no longer need Pap tests. If you have had past treatment for cervical cancer or a condition that could lead to cancer, you need Pap tests and screening for cancer for at least 20 years after your treatment. If Pap tests have been discontinued, risk factors (such as a new sexual partner) need to be reassessed to determine if screening should be resumed. Some women have medical problems that increase the chance of getting cervical cancer. In these cases, your caregiver may recommend more frequent screening and Pap tests.  The human papillomavirus (HPV) test is an additional test that may be used for cervical cancer screening. The HPV test looks for the virus that can cause the cell changes on the cervix. The cells collected during the Pap test can be tested for HPV. The HPV test could be used to screen women aged 27 years and older, and should be used in women of any age who have unclear Pap test results. After the age of 67, women should have HPV testing at the same frequency as a Pap test.  Colorectal cancer can be detected and often prevented. Most routine colorectal cancer screening begins at the age of 37 and continues through age 24. However, your caregiver may recommend screening at an earlier age if you have risk factors for colon cancer. On a yearly basis, your caregiver may provide home test kits to check for hidden blood in the stool. Use of a small camera at the end of a tube, to directly examine the colon (sigmoidoscopy or colonoscopy), can detect the earliest forms of colorectal cancer. Talk to your caregiver about this at age 43, when routine screening begins. Direct examination of the colon should be repeated every 5 to 10 years through age 30, unless early forms of pre-cancerous polyps or small growths are found.  Hepatitis C  blood testing is recommended for all people born from [birth year] through 1965 and any individual with known risks for hepatitis C.  Practice safe sex. Use condoms and avoid high-risk sexual practices to reduce the spread of sexually transmitted infections (STIs). Sexually active women aged 3 and younger should be checked for Chlamydia, which is a common sexually transmitted infection. Older women with new or  multiple partners should also be tested for Chlamydia. Testing for other STIs is recommended if you are sexually active and at increased risk.  Osteoporosis is a disease in which the bones lose minerals and strength with aging. This can result in serious bone fractures. The risk of osteoporosis can be identified using a bone density scan. Women ages 84 and over and women at risk for fractures or osteoporosis should discuss screening with their caregivers. Ask your caregiver whether you should be taking a calcium supplement or vitamin D to reduce the rate of osteoporosis.  Menopause can be associated with physical symptoms and risks. Hormone replacement therapy is available to decrease symptoms and risks. You should talk to your caregiver about whether hormone replacement therapy is right for you.  Use sunscreen. Apply sunscreen liberally and repeatedly throughout the day. You should seek shade when your shadow is shorter than you. Protect yourself by wearing long sleeves, pants, a wide-brimmed hat, and sunglasses year round, whenever you are outdoors.  Notify your caregiver of new moles or changes in moles, especially if there is a change in shape or color. Also notify your caregiver if a mole is larger than the size of a pencil eraser.  Stay current with your immunizations. Document Released: 10/05/2010 Document Revised: 07/17/2012 Document Reviewed: 10/05/2010 [facility] Patient Information 2014 [place].

## 2014-01-02 NOTE — Progress Notes (Addendum)
Dwain SarnaDawn Wengert 02-Nov-1965 [id number]        48 y.o.  [id number] for annual exam.  Several issues noted below.  Past medical history,surgical history, problem list, medications, allergies, family history and social history were all reviewed and documented as reviewed in the EPIC chart.  ROS:  12 system ROS performed with pertinent positives and negatives included in the history, assessment and plan.   Additional significant findings :  none   Exam: [facility] personassistant Filed Vitals:   01/02/14 1104  BP: 110/78  Height: 5\' 7"  (1.702 m)  Weight: 168 lb (76.204 kg)   General appearance:  Normal affect, orientation and appearance. Skin: Grossly normal HEENT: Without gross lesions.  Throat clear.  No cervical or supraclavicular adenopathy.  Bilateral tympanic membranes dull consistent with serous otitis. No erythema or evidence of infection. Thyroid normal.  Lungs:  Clear without wheezing, rales or rhonchi Cardiac: RR, without RMG Abdominal:  Soft, nontender, without masses, guarding, rebound, organomegaly or hernia Breasts:  Examined lying and sitting without masses, retractions, discharge or axillary adenopathy. Pelvic:  Ext/BUS/vagina external BUS vagina normal  Adnexa  Without masses or tenderness    Anus and perineum  Normal   Rectovaginal  Normal sphincter tone without palpated masses or tenderness.    Assessment/Plan:  48 y.o. 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 .   1. Serous otitis with viral URI. Nagging cough for 2 weeks with upper congestion. Treating with OTC products. Slowly getting better. Will cover with Vibramycin 100 mg twice a day x7 days. Follow up if symptoms persist or worsen. 2. Pap smear 2014. No Pap smear done today.  History of LEEP in 1998 elsewhere. 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 3. Mammography 2013. Strongly recommended patient  schedule mammogram now she notices my recommendation. Benefits of early detection reviewed. Patient agrees to schedule. SBE monthly reviewed. 4. History of positive ANA 2013 with speckled pattern and titer 1:40. ANA positive 2014 a titer less than 1-40. Repeat ANA today. 5. History of galactorrhea in the past. No current galactorrhea reported. Exam is negative. Prolactin was 12 last year. Will follow and patient will report any galactorrhea. 6. Health maintenance. Baseline CBC comprehensive metabolic panel lipid profile urinalysis ordered along with her ANA. She did relate being fatigued and asked about checking a thyroid and TSH was ordered.     Dara LordsFONTAINE,Jensen Cheramie P MD, 11:39 AM 01/02/2014

## 2014-01-03 LAB — URINALYSIS W MICROSCOPIC + REFLEX CULTURE
BILIRUBIN URINE: NEGATIVE
CRYSTALS: NONE SEEN
Casts: NONE SEEN
Glucose, UA: NEGATIVE mg/dL
Hgb urine dipstick: NEGATIVE
Ketones, ur: NEGATIVE mg/dL
LEUKOCYTES UA: NEGATIVE
NITRITE: NEGATIVE
PROTEIN: NEGATIVE mg/dL
SPECIFIC GRAVITY, URINE: 1.021 (ref 1.005–1.030)
SQUAMOUS EPITHELIAL / LPF: NONE SEEN
UROBILINOGEN UA: 0.2 mg/dL (ref 0.0–1.0)
pH: 7 (ref 5.0–8.0)

## 2014-01-03 LAB — ANA: Anti Nuclear Antibody(ANA): NEGATIVE

## 2014-01-04 ENCOUNTER — Other Ambulatory Visit: Payer: Self-pay | Admitting: Gynecology

## 2014-01-04 MED ORDER — NITROFURANTOIN MONOHYD MACRO 100 MG PO CAPS: 100.0000 mg | ORAL_CAPSULE | Freq: Two times a day (BID) | ORAL | Status: DC

## 2014-01-05 LAB — URINE CULTURE: Colony Count: >=100000

## 2014-02-04 ENCOUNTER — Encounter: Payer: Self-pay | Admitting: Gynecology

## 2014-08-13 ENCOUNTER — Telehealth: Payer: Self-pay | Admitting: *Deleted

## 2014-08-13 NOTE — Telephone Encounter (Signed)
Pt called wanting the name of a dermatology office I left on her voicemail  dermatology with # to call.

## 2015-01-17 ENCOUNTER — Ambulatory Visit (INDEPENDENT_AMBULATORY_CARE_PROVIDER_SITE_OTHER): Payer: PRIVATE HEALTH INSURANCE | Admitting: Gynecology

## 2015-01-17 ENCOUNTER — Encounter: Payer: Self-pay | Admitting: Gynecology

## 2015-01-17 VITALS — BP 122/78 | Ht 66.0 in | Wt 178.0 lb

## 2015-01-17 DIAGNOSIS — R21 Rash and other nonspecific skin eruption: Secondary | ICD-10-CM

## 2015-01-17 DIAGNOSIS — Z01419 Encounter for gynecological examination (general) (routine) without abnormal findings: Secondary | ICD-10-CM

## 2015-01-17 LAB — CBC WITH DIFFERENTIAL/PLATELET
Basophils Absolute: 0 10*3/uL (ref 0.0–0.1)
Basophils Relative: 0 % (ref 0–1)
EOS PCT: 3 % (ref 0–5)
Eosinophils Absolute: 0.3 10*3/uL (ref 0.0–0.7)
HCT: 39 % (ref 36.0–46.0)
Hemoglobin: 13.4 g/dL (ref 12.0–15.0)
Lymphocytes Relative: 25 % (ref 12–46)
Lymphs Abs: 2.3 10*3/uL (ref 0.7–4.0)
MCH: 30.6 pg (ref 26.0–34.0)
MCHC: 34.4 g/dL (ref 30.0–36.0)
MCV: 89 fL (ref 78.0–100.0)
MONO ABS: 0.7 10*3/uL (ref 0.1–1.0)
MPV: 9.9 fL (ref 8.6–12.4)
Monocytes Relative: 7 % (ref 3–12)
Neutro Abs: 6 10*3/uL (ref 1.7–7.7)
Neutrophils Relative %: 65 % (ref 43–77)
PLATELETS: 322 10*3/uL (ref 150–400)
RBC: 4.38 MIL/uL (ref 3.87–5.11)
RDW: 13.8 % (ref 11.5–15.5)
WBC: 9.3 10*3/uL (ref 4.0–10.5)

## 2015-01-17 LAB — COMPREHENSIVE METABOLIC PANEL
ALT: 15 U/L (ref 6–29)
AST: 15 U/L (ref 10–35)
Albumin: 3.9 g/dL (ref 3.6–5.1)
Alkaline Phosphatase: 61 U/L (ref 33–115)
BUN: 16 mg/dL (ref 7–25)
CHLORIDE: 103 mmol/L (ref 98–110)
CO2: 22 mmol/L (ref 20–31)
Calcium: 8.9 mg/dL (ref 8.6–10.2)
Creat: 0.76 mg/dL (ref 0.50–1.10)
Glucose, Bld: 125 mg/dL — ABNORMAL HIGH (ref 65–99)
Potassium: 3.8 mmol/L (ref 3.5–5.3)
SODIUM: 136 mmol/L (ref 135–146)
Total Bilirubin: 0.3 mg/dL (ref 0.2–1.2)
Total Protein: 6.8 g/dL (ref 6.1–8.1)

## 2015-01-17 LAB — SEDIMENTATION RATE: Sed Rate: 11 mm/hr (ref 0–20)

## 2015-01-17 LAB — LIPID PANEL
Cholesterol: 205 mg/dL — ABNORMAL HIGH (ref 125–200)
HDL: 64 mg/dL (ref >=46)
LDL CALC: 119 mg/dL (ref <130)
Total CHOL/HDL Ratio: 3.2 Ratio (ref <=5.0)
Triglycerides: 110 mg/dL (ref <150)
VLDL: 22 mg/dL (ref <30)

## 2015-01-17 LAB — TSH: TSH: 2.429 u[IU]/mL (ref 0.350–4.500)

## 2015-01-17 NOTE — Patient Instructions (Signed)
Call to Schedule your mammogram  Facilities in [place]: 1)  [facility], [address] Phone: [phone] 2)  [facility], [address] Phone: [phone] 3)  Dr. Bertrand at [facility]  [address] Phone: [phone]     Mammogram A mammogram is an X-ray test to find changes in a woman's breast. You should get a mammogram if:  You are 40 years of age or older  You have risk factors.   Your doctor recommends that you have one.  BEFORE THE TEST  Do not schedule the test the week before your period, especially if your breasts are sore during this time.  On the day of your mammogram:  Wash your breasts and armpits well. After washing, do not put on any deodorant or talcum powder on until after your test.   Eat and drink as you usually do.   Take your medicines as usual.   If you are diabetic and take insulin, make sure you:   Eat before coming for your test.   Take your insulin as usual.   If you cannot keep your appointment, call before the appointment to cancel. Schedule another appointment.  TEST  You will need to undress from the waist up. You will put on a hospital gown.   Your breast will be put on the mammogram machine, and it will press firmly on your breast with a piece of plastic called a compression paddle. This will make your breast flatter so that the machine can X-ray all parts of your breast.   Both breasts will be X-rayed. Each breast will be X-rayed from above and from the side. An X-ray might need to be taken again if the picture is not good enough.   The mammogram will last about 15 to 30 minutes.  AFTER THE TEST Finding out the results of your test Ask when your test results will be ready. Make sure you get your test results.  Document Released: 06/18/2008 Document Revised: 03/11/2011 Document Reviewed: 06/18/2008 [employer] Patient  Information 2012 [employer].  You may obtain a copy of any labs that were done today by logging onto MyChart as outlined in the instructions provided with your AVS (after visit summary). The office will not call with normal lab results but certainly if there are any significant abnormalities then we will contact you.   Health Maintenance Adopting a healthy lifestyle and getting preventive care can go a long way to promote health and wellness. Talk with your health care provider about what schedule of regular examinations is right for you. This is a good chance for you to check in with your provider about disease prevention and staying healthy. In between checkups, there are plenty of things you can do on your own. Experts have done a lot of research about which lifestyle changes and preventive measures are most likely to keep you healthy. Ask your health care provider for more information. WEIGHT AND DIET  Eat a healthy diet  Be sure to include plenty of vegetables, fruits, low-fat dairy products, and lean protein.  Do not eat a lot of foods high in solid fats, added sugars, or salt.  Get regular exercise. This is one of the most important things you can do for your health.  Most adults should exercise for at least 150 minutes each week. The exercise should increase your heart rate and make you sweat (moderate-intensity exercise).    Most adults should also do strengthening exercises at least twice a week. This is in addition to the moderate-intensity exercise.  Maintain a healthy weight  Body mass index (BMI) is a measurement that can be used to identify possible weight problems. It estimates body fat based on height and weight. Your health care provider can help determine your BMI and help you achieve or maintain a healthy weight.  For females 20 years of age and older:   A BMI below 18.5 is considered underweight.  A BMI of 18.5 to 24.9 is normal.  A BMI of 25 to 29.9 is  considered overweight.  A BMI of 30 and above is considered obese.  Watch levels of cholesterol and blood lipids  You should start having your blood tested for lipids and cholesterol at 49 years of age, then have this test every 5 years.  You may need to have your cholesterol levels checked more often if:  Your lipid or cholesterol levels are high.  You are older than 50 years of age.  You are at high risk for heart disease.  CANCER SCREENING   Lung Cancer  Lung cancer screening is recommended for adults 55-80 years old who are at high risk for lung cancer because of a history of smoking.  A yearly low-dose CT scan of the lungs is recommended for people who:  Currently smoke.  Have quit within the past 15 years.  Have at least a 30-pack-year history of smoking. A pack year is smoking an average of one pack of cigarettes a day for 1 year.  Yearly screening should continue until it has been 15 years since you quit.  Yearly screening should stop if you develop a health problem that would prevent you from having lung cancer treatment.  Breast Cancer  Practice breast self-awareness. This means understanding how your breasts normally appear and feel.  It also means doing regular breast self-exams. Let your health care provider know about any changes, no matter how small.  If you are in your 20s or 30s, you should have a clinical breast exam (CBE) by a health care provider every 1-3 years as part of a regular health exam.  If you are 40 or older, have a CBE every year. Also consider having a breast X-ray (mammogram) every year.  If you have a family history of breast cancer, talk to your health care provider about genetic screening.  If you are at high risk for breast cancer, talk to your health care provider about having an MRI and a mammogram every year.  Breast cancer gene (BRCA) assessment is recommended for women who have family members with BRCA-related cancers.  BRCA-related cancers include:  Breast.  Ovarian.  Tubal.  Peritoneal cancers.  Results of the assessment will determine the need for genetic counseling and BRCA1 and BRCA2 testing. Cervical Cancer Routine pelvic examinations to screen for cervical cancer are no longer recommended for nonpregnant women who are considered low risk for cancer of the pelvic organs (ovaries, uterus, and vagina) and who do not have symptoms. A pelvic examination may be necessary if you have symptoms including those associated with pelvic infections. Ask your health care provider if a screening pelvic exam is right for you.   The Pap test is the screening test for cervical cancer for women who are considered at risk.  If you had a hysterectomy for a problem that was not cancer or a condition that could lead to cancer, then you no longer need   Pap tests.  If you are older than 65 years, and you have had normal Pap tests for the past 10 years, you no longer need to have Pap tests.  If you have had past treatment for cervical cancer or a condition that could lead to cancer, you need Pap tests and screening for cancer for at least 20 years after your treatment.  If you no longer get a Pap test, assess your risk factors if they change (such as having a new sexual partner). This can affect whether you should start being screened again.  Some women have medical problems that increase their chance of getting cervical cancer. If this is the case for you, your health care provider may recommend more frequent screening and Pap tests.  The human papillomavirus (HPV) test is another test that may be used for cervical cancer screening. The HPV test looks for the virus that can cause cell changes in the cervix. The cells collected during the Pap test can be tested for HPV.  The HPV test can be used to screen women 71 years of age and older. Getting tested for HPV can extend the interval between normal Pap tests from three to  five years.  An HPV test also should be used to screen women of any age who have unclear Pap test results.  After 49 years of age, women should have HPV testing as often as Pap tests.  Colorectal Cancer  This type of cancer can be detected and often prevented.  Routine colorectal cancer screening usually begins at 49 years of age and continues through 49 years of age.  Your health care provider may recommend screening at an earlier age if you have risk factors for colon cancer.  Your health care provider may also recommend using home test kits to check for hidden blood in the stool.  A small camera at the end of a tube can be used to examine your colon directly (sigmoidoscopy or colonoscopy). This is done to check for the earliest forms of colorectal cancer.  Routine screening usually begins at age 78.  Direct examination of the colon should be repeated every 5-10 years through 49 years of age. However, you may need to be screened more often if early forms of precancerous polyps or small growths are found. Skin Cancer  Check your skin from head to toe regularly.  Tell your health care provider about any new moles or changes in moles, especially if there is a change in a mole's shape or color.  Also tell your health care provider if you have a mole that is larger than the size of a pencil eraser.  Always use sunscreen. Apply sunscreen liberally and repeatedly throughout the day.  Protect yourself by wearing long sleeves, pants, a wide-brimmed hat, and sunglasses whenever you are outside. HEART DISEASE, DIABETES, AND HIGH BLOOD PRESSURE   Have your blood pressure checked at least every 1-2 years. High blood pressure causes heart disease and increases the risk of stroke.  If you are between 52 years and 72 years old, ask your health care provider if you should take aspirin to prevent strokes.  Have regular diabetes screenings. This involves taking a blood sample to check your  fasting blood sugar level.  If you are at a normal weight and have a low risk for diabetes, have this test once every three years after 49 years of age.  If you are overweight and have a high risk for diabetes, consider being tested at  a younger age or more often. PREVENTING INFECTION  Hepatitis B  If you have a higher risk for hepatitis B, you should be screened for this virus. You are considered at high risk for hepatitis B if:  You were born in a country where hepatitis B is common. Ask your health care provider which countries are considered high risk.  Your parents were born in a high-risk country, and you have not been immunized against hepatitis B (hepatitis B vaccine).  You have HIV or AIDS.  You use needles to inject street drugs.  You live with someone who has hepatitis B.  You have had sex with someone who has hepatitis B.  You get hemodialysis treatment.  You take certain medicines for conditions, including cancer, organ transplantation, and autoimmune conditions. Hepatitis C  Blood testing is recommended for:  Everyone born from [birth year] through 1965.  Anyone with known risk factors for hepatitis C. Sexually transmitted infections (STIs)  You should be screened for sexually transmitted infections (STIs) including gonorrhea and chlamydia if:  You are sexually active and are younger than 49 years of age.  You are older than 49 years of age and your health care provider tells you that you are at risk for this type of infection.  Your sexual activity has changed since you were last screened and you are at an increased risk for chlamydia or gonorrhea. Ask your health care provider if you are at risk.  If you do not have HIV, but are at risk, it may be recommended that you take a prescription medicine daily to prevent HIV infection. This is called pre-exposure prophylaxis (PrEP). You are considered at risk if:  You are sexually active and do not regularly use condoms or  know the HIV status of your partner(s).  You take drugs by injection.  You are sexually active with a partner who has HIV. Talk with your health care provider about whether you are at high risk of being infected with HIV. If you choose to begin PrEP, you should first be tested for HIV. You should then be tested every 3 months for as long as you are taking PrEP.  PREGNANCY   If you are premenopausal and you may become pregnant, ask your health care provider about preconception counseling.  If you may become pregnant, take 400 to 800 micrograms (mcg) of folic acid every day.  If you want to prevent pregnancy, talk to your health care provider about birth control (contraception). OSTEOPOROSIS AND MENOPAUSE   Osteoporosis is a disease in which the bones lose minerals and strength with aging. This can result in serious bone fractures. Your risk for osteoporosis can be identified using a bone density scan.  If you are 64 years of age or older, or if you are at risk for osteoporosis and fractures, ask your health care provider if you should be screened.  Ask your health care provider whether you should take a calcium or vitamin D supplement to lower your risk for osteoporosis.  Menopause may have certain physical symptoms and risks.  Hormone replacement therapy may reduce some of these symptoms and risks. Talk to your health care provider about whether hormone replacement therapy is right for you.  HOME CARE INSTRUCTIONS   Schedule regular health, dental, and eye exams.  Stay current with your immunizations.   Do not use any tobacco products including cigarettes, chewing tobacco, or electronic cigarettes.  If you are pregnant, do not drink alcohol.  If you are breastfeeding,  limit how much and how often you drink alcohol.  Limit alcohol intake to no more than 1 drink per day for nonpregnant women. One drink equals 12 ounces of beer, 5 ounces of wine, or 1 ounces of hard liquor.  Do  not use street drugs.  Do not share needles.  Ask your health care provider for help if you need support or information about quitting drugs.  Tell your health care provider if you often feel depressed.  Tell your health care provider if you have ever been abused or do not feel safe at home. Document Released: 10/05/2010 Document Revised: 08/06/2013 Document Reviewed: 02/21/2013 [facility] Patient Information 2015 [place], [place]. This information is not intended to replace advice given to you by your health care provider. Make sure you discuss any questions you have with your health care provider.

## 2015-01-17 NOTE — Progress Notes (Signed)
Ashley SarnaDawn Virrueta [birth year] [id number]        49 y.o.  [id number] for annual exam.  Several issues noted below.  Past medical history,surgical history, problem list, medications, allergies, family history and social history were all reviewed and documented as reviewed in the EPIC chart.  ROS:  Performed with pertinent positives and negatives included in the history, assessment and plan.   Additional significant findings :  none   Exam: [facility] personassistant Filed Vitals:   01/17/15 1509  BP: 122/78  Height: 5\' 6"  (1.676 m)  Weight: 178 lb (80.74 kg)   General appearance:  Normal affect, orientation and appearance. Skin: Grossly normal HEENT: Without gross lesions.  No cervical or supraclavicular adenopathy. Thyroid normal.  Lungs:  Clear without wheezing, rales or rhonchi Cardiac: RR, without RMG Abdominal:  Soft, nontender, without masses, guarding, rebound, organomegaly or hernia Breasts:  Examined lying and sitting without masses, retractions, discharge or axillary adenopathy. Pelvic:  Ext/BUS/vagina normal  Adnexa  Without masses or tenderness    Anus and perineum  Normal   Rectovaginal  Normal sphincter tone without palpated masses or tenderness.    Assessment/Plan:  49 y.o. 392P2002 female for annual exam. Status post TLH 2010 for leiomyoma and menorrhagia.  1. Pap smear 2014. No Pap smear done today. History of LEEP 1998 elsewhere. Pathology showed CIN-1 with clear margins. Normal Pap smears since then. Plan repeat Pap smear next year a 3 year interval. 2. History of galactorrhea. Prolactins normal in the past. No longer reporting lactorrhea. We'll monitor and follow up if any recurrent galactorrhea. 3. Mammography 2012. I again strongly recommended patient schedule mammography. The benefits of early detection reviewed. Patient promises she'll schedule her mammogram. SBE monthly reviewed. 4. History of positive ANA 2013 with speckled pattern 1-40 titer. Repeat 2014 positive but titer less  than 1-40.  Repeat ANA 2015 was negative. Being evaluated for recurrent undiagnosed generalized rash. We'll go ahead and recheck her and ANA and sedimentation rate. 5. Health maintenance. Baseline CBC comprehensive metabolic panel lipid profile urinalysis TSH ordered. Follow up in one year, sooner as needed.   Dara LordsFONTAINE,TIMOTHY P MD, 3:32 PM 01/17/2015

## 2015-01-18 LAB — URINALYSIS W MICROSCOPIC + REFLEX CULTURE
BILIRUBIN URINE: NEGATIVE
CASTS: NONE SEEN [LPF]
Crystals: NONE SEEN [HPF]
GLUCOSE, UA: NEGATIVE
HGB URINE DIPSTICK: NEGATIVE
KETONES UR: NEGATIVE
Leukocytes, UA: NEGATIVE
NITRITE: NEGATIVE
PH: 5 (ref 5.0–8.0)
Protein, ur: NEGATIVE
RBC / HPF: NONE SEEN RBC/HPF (ref <=2)
SQUAMOUS EPITHELIAL / LPF: NONE SEEN [HPF] (ref <=5)
Specific Gravity, Urine: 1.006 (ref 1.001–1.035)
Yeast: NONE SEEN [HPF]

## 2015-01-20 LAB — URINE CULTURE: Colony Count: >=100000

## 2015-01-21 ENCOUNTER — Other Ambulatory Visit: Payer: Self-pay | Admitting: Gynecology

## 2015-01-21 DIAGNOSIS — R7309 Other abnormal glucose: Secondary | ICD-10-CM

## 2015-01-21 LAB — ANA: Anti Nuclear Antibody(ANA): NEGATIVE

## 2015-01-21 MED ORDER — SULFAMETHOXAZOLE-TRIMETHOPRIM 800-160 MG PO TABS: 1.0000 | ORAL_TABLET | Freq: Two times a day (BID) | ORAL | Status: DC

## 2015-01-24 ENCOUNTER — Other Ambulatory Visit: Payer: PRIVATE HEALTH INSURANCE

## 2015-01-24 DIAGNOSIS — R7309 Other abnormal glucose: Secondary | ICD-10-CM

## 2015-01-24 LAB — HEMOGLOBIN A1C
Hgb A1c MFr Bld: 5.7 % — ABNORMAL HIGH (ref <5.7)
Mean Plasma Glucose: 117 mg/dL — ABNORMAL HIGH (ref <117)

## 2015-01-25 LAB — GLUCOSE, FASTING: GLUCOSE, FASTING: 94 mg/dL (ref 65–99)

## 2015-09-03 DIAGNOSIS — L723 Sebaceous cyst: Secondary | ICD-10-CM

## 2015-09-03 HISTORY — DX: Sebaceous cyst: L72.3

## 2015-09-11 DIAGNOSIS — Z09 Encounter for follow-up examination after completed treatment for conditions other than malignant neoplasm: Secondary | ICD-10-CM

## 2015-09-11 HISTORY — DX: Encounter for follow-up examination after completed treatment for conditions other than malignant neoplasm: Z09

## 2015-11-06 ENCOUNTER — Encounter: Payer: Self-pay | Admitting: Gynecology

## 2015-11-06 ENCOUNTER — Ambulatory Visit (INDEPENDENT_AMBULATORY_CARE_PROVIDER_SITE_OTHER): Payer: PRIVATE HEALTH INSURANCE | Admitting: Gynecology

## 2015-11-06 VITALS — BP 116/74 | Ht 66.0 in | Wt 181.0 lb

## 2015-11-06 DIAGNOSIS — R894 Abnormal immunological findings in specimens from other organs, systems and tissues: Secondary | ICD-10-CM | POA: Diagnosis not present

## 2015-11-06 DIAGNOSIS — Z01419 Encounter for gynecological examination (general) (routine) without abnormal findings: Secondary | ICD-10-CM

## 2015-11-06 DIAGNOSIS — E063 Autoimmune thyroiditis: Secondary | ICD-10-CM | POA: Diagnosis not present

## 2015-11-06 DIAGNOSIS — R760 Raised antibody titer: Secondary | ICD-10-CM

## 2015-11-06 NOTE — Progress Notes (Signed)
    Ashley Arellano 1965/09/04 [id number]        50 y.o.  [id number]  for annual exam.  Several issues noted below.  Past medical history,surgical history, problem list, medications, allergies, family history and social history were all reviewed and documented as reviewed in the EPIC chart.  ROS:  Performed with pertinent positives and negatives included in the history, assessment and plan.   Additional significant findings :  None   Exam: Ashley Arellano assistant Vitals:   11/06/15 1538  BP: 116/74  Weight: 181 lb (82.1 kg)  Height: 5\' 6"  (1.676 m)   Body mass index is 29.21 kg/m.  General appearance:  Normal affect, orientation and appearance. Skin: Grossly normal HEENT: Without gross lesions.  No cervical or supraclavicular adenopathy. Thyroid normal.  Lungs:  Clear without wheezing, rales or rhonchi Cardiac: RR, without RMG Abdominal:  Soft, nontender, without masses, guarding, rebound, organomegaly or hernia Breasts:  Examined lying and sitting without masses, retractions, discharge or axillary adenopathy. Pelvic:  Ext/BUS/Vagina normal. Pap smear of cuff done  Adnexa without masses or tenderness    Anus and perineum normal   Rectovaginal normal sphincter tone without palpated masses or tenderness.    Assessment/Plan:  50 y.o. G30P2002 female for annual exam.   1. Status post TLH 2010 for leiomyoma menorrhagia. 2. Pap smear 2014. Pap smear done today. History of LEEP 1998 with CIN and clear margins. Options to stop screening and she is status post hysterectomy for benign indications reviewed. 3. Mammography 2012. Patient knows she is overdue and agrees to schedule. Request slip given to her to provide to [facility]. SBE monthly reviewed. 4. 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 Saw allergist initially and subsequently rheumatologist. She also had a minimally elevated sedimentation rate C-reactive protein and total  complement. She was asked to have repeat her lupus anticoagulant and anticardiolipin antibodies.  She is requesting referral to endocrinology as far as her thyroid and to hematology or follow up of these blood tests studies and recommendations as far as's treatment such as anticoagulation. I did recommend she start a baby aspirin today. 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 We'll go ahead and arrange for endocrinology and hematology follow up. She'll provide copies of all her labs that she carries with her to them. We'll go ahead and check lupus anticoagulant ANA anticardiolipin antibodies. 5. Health maintenance. She had a CBC and CMP done elsewhere this year. We'll check epic profile and urinalysis along with the other blood work.  Follow up here in one year. Follow up with the above consults when arranged.   Ashley Lords MD, 4:27 PM 11/06/2015

## 2015-11-06 NOTE — Patient Instructions (Signed)
Office will call you to arrange for the hematology and endocrinology appointments.  Schedule your mammogram  You may obtain a copy of any labs that were done today by logging onto MyChart as outlined in the instructions provided with your AVS (after visit summary). The office will not call with normal lab results but certainly if there are any significant abnormalities then we will contact you.   Health Maintenance Adopting a healthy lifestyle and getting preventive care can go a long way to promote health and wellness. Talk with your health care provider about what schedule of regular examinations is right for you. This is a good chance for you to check in with your provider about disease prevention and staying healthy. In between checkups, there are plenty of things you can do on your own. Experts have done a lot of research about which lifestyle changes and preventive measures are most likely to keep you healthy. Ask your health care provider for more information. WEIGHT AND DIET  Eat a healthy diet  Be sure to include plenty of vegetables, fruits, low-fat dairy products, and lean protein.  Do not eat a lot of foods high in solid fats, added sugars, or salt.  Get regular exercise. This is one of the most important things you can do for your health.  Most adults should exercise for at least 150 minutes each week. The exercise should increase your heart rate and make you sweat (moderate-intensity exercise).  Most adults should also do strengthening exercises at least twice a week. This is in addition to the moderate-intensity exercise.  Maintain a healthy weight  Body mass index (BMI) is a measurement that can be used to identify possible weight problems. It estimates body fat based on height and weight. Your health care provider can help determine your BMI and help you achieve or maintain a healthy weight.  For females 70 years of age and older:   A BMI below 18.5 is considered  underweight.  A BMI of 18.5 to 24.9 is normal.  A BMI of 25 to 29.9 is considered overweight.  A BMI of 30 and above is considered obese.  Watch levels of cholesterol and blood lipids  You should start having your blood tested for lipids and cholesterol at 50 years of age, then have this test every 5 years.  You may need to have your cholesterol levels checked more often if:  Your lipid or cholesterol levels are high.  You are older than 50 years of age.  You are at high risk for heart disease.  CANCER SCREENING   Lung Cancer  Lung cancer screening is recommended for adults 38-6 years old who are at high risk for lung cancer because of a history of smoking.  A yearly low-dose CT scan of the lungs is recommended for people who:  Currently smoke.  Have quit within the past 15 years.  Have at least a 30-pack-year history of smoking. A pack year is smoking an average of one pack of cigarettes a day for 1 year.  Yearly screening should continue until it has been 15 years since you quit.  Yearly screening should stop if you develop a health problem that would prevent you from having lung cancer treatment.  Breast Cancer  Practice breast self-awareness. This means understanding how your breasts normally appear and feel.  It also means doing regular breast self-exams. Let your health care provider know about any changes, no matter how small.  If you are in your 69s  or 13s, you should have a clinical breast exam (CBE) by a health care provider every 1-3 years as part of a regular health exam.  If you are 16 or older, have a CBE every year. Also consider having a breast X-ray (mammogram) every year.  If you have a family history of breast cancer, talk to your health care provider about genetic screening.  If you are at high risk for breast cancer, talk to your health care provider about having an MRI and a mammogram every year.  Breast cancer gene (BRCA) assessment is  recommended for women who have family members with BRCA-related cancers. BRCA-related cancers include:  Breast.  Ovarian.  Tubal.  Peritoneal cancers.  Results of the assessment will determine the need for genetic counseling and BRCA1 and BRCA2 testing. Cervical Cancer Routine pelvic examinations to screen for cervical cancer are no longer recommended for nonpregnant women who are considered low risk for cancer of the pelvic organs (ovaries, uterus, and vagina) and who do not have symptoms. A pelvic examination may be necessary if you have symptoms including those associated with pelvic infections. Ask your health care provider if a screening pelvic exam is right for you.   The Pap test is the screening test for cervical cancer for women who are considered at risk.  If you had a hysterectomy for a problem that was not cancer or a condition that could lead to cancer, then you no longer need Pap tests.  If you are older than 65 years, and you have had normal Pap tests for the past 10 years, you no longer need to have Pap tests.  If you have had past treatment for cervical cancer or a condition that could lead to cancer, you need Pap tests and screening for cancer for at least 20 years after your treatment.  If you no longer get a Pap test, assess your risk factors if they change (such as having a new sexual partner). This can affect whether you should start being screened again.  Some women have medical problems that increase their chance of getting cervical cancer. If this is the case for you, your health care provider may recommend more frequent screening and Pap tests.  The human papillomavirus (HPV) test is another test that may be used for cervical cancer screening. The HPV test looks for the virus that can cause cell changes in the cervix. The cells collected during the Pap test can be tested for HPV.  The HPV test can be used to screen women 51 years of age and older. Getting tested  for HPV can extend the interval between normal Pap tests from three to five years.  An HPV test also should be used to screen women of any age who have unclear Pap test results.  After 50 years of age, women should have HPV testing as often as Pap tests.  Colorectal Cancer  This type of cancer can be detected and often prevented.  Routine colorectal cancer screening usually begins at 50 years of age and continues through 50 years of age.  Your health care provider may recommend screening at an earlier age if you have risk factors for colon cancer.  Your health care provider may also recommend using home test kits to check for hidden blood in the stool.  A small camera at the end of a tube can be used to examine your colon directly (sigmoidoscopy or colonoscopy). This is done to check for the earliest forms of colorectal cancer.  Routine screening usually begins at age 100.  Direct examination of the colon should be repeated every 5-10 years through 51 years of age. However, you may need to be screened more often if early forms of precancerous polyps or small growths are found. Skin Cancer  Check your skin from head to toe regularly.  Tell your health care provider about any new moles or changes in moles, especially if there is a change in a mole's shape or color.  Also tell your health care provider if you have a mole that is larger than the size of a pencil eraser.  Always use sunscreen. Apply sunscreen liberally and repeatedly throughout the day.  Protect yourself by wearing long sleeves, pants, a wide-brimmed hat, and sunglasses whenever you are outside. HEART DISEASE, DIABETES, AND HIGH BLOOD PRESSURE   Have your blood pressure checked at least every 1-2 years. High blood pressure causes heart disease and increases the risk of stroke.  If you are between 61 years and 46 years old, ask your health care provider if you should take aspirin to prevent strokes.  Have regular  diabetes screenings. This involves taking a blood sample to check your fasting blood sugar level.  If you are at a normal weight and have a low risk for diabetes, have this test once every three years after 50 years of age.  If you are overweight and have a high risk for diabetes, consider being tested at a younger age or more often. PREVENTING INFECTION  Hepatitis B  If you have a higher risk for hepatitis B, you should be screened for this virus. You are considered at high risk for hepatitis B if:  You were born in a country where hepatitis B is common. Ask your health care provider which countries are considered high risk.  Your parents were born in a high-risk country, and you have not been immunized against hepatitis B (hepatitis B vaccine).  You have HIV or AIDS.  You use needles to inject street drugs.  You live with someone who has hepatitis B.  You have had sex with someone who has hepatitis B.  You get hemodialysis treatment.  You take certain medicines for conditions, including cancer, organ transplantation, and autoimmune conditions. Hepatitis C  Blood testing is recommended for:  Everyone born from [birth year] through 1965.  Anyone with known risk factors for hepatitis C. Sexually transmitted infections (STIs)  You should be screened for sexually transmitted infections (STIs) including gonorrhea and chlamydia if:  You are sexually active and are younger than 50 years of age.  You are older than 50 years of age and your health care provider tells you that you are at risk for this type of infection.  Your sexual activity has changed since you were last screened and you are at an increased risk for chlamydia or gonorrhea. Ask your health care provider if you are at risk.  If you do not have HIV, but are at risk, it may be recommended that you take a prescription medicine daily to prevent HIV infection. This is called pre-exposure prophylaxis (PrEP). You are considered at  risk if:  You are sexually active and do not regularly use condoms or know the HIV status of your partner(s).  You take drugs by injection.  You are sexually active with a partner who has HIV. Talk with your health care provider about whether you are at high risk of being infected with HIV. If you choose to begin PrEP, you should first  be tested for HIV. You should then be tested every 3 months for as long as you are taking PrEP.  PREGNANCY   If you are premenopausal and you may become pregnant, ask your health care provider about preconception counseling.  If you may become pregnant, take 400 to 800 micrograms (mcg) of folic acid every day.  If you want to prevent pregnancy, talk to your health care provider about birth control (contraception). OSTEOPOROSIS AND MENOPAUSE   Osteoporosis is a disease in which the bones lose minerals and strength with aging. This can result in serious bone fractures. Your risk for osteoporosis can be identified using a bone density scan.  If you are 17 years of age or older, or if you are at risk for osteoporosis and fractures, ask your health care provider if you should be screened.  Ask your health care provider whether you should take a calcium or vitamin D supplement to lower your risk for osteoporosis.  Menopause may have certain physical symptoms and risks.  Hormone replacement therapy may reduce some of these symptoms and risks. Talk to your health care provider about whether hormone replacement therapy is right for you.  HOME CARE INSTRUCTIONS   Schedule regular health, dental, and eye exams.  Stay current with your immunizations.   Do not use any tobacco products including cigarettes, chewing tobacco, or electronic cigarettes.  If you are pregnant, do not drink alcohol.  If you are breastfeeding, limit how much and how often you drink alcohol.  Limit alcohol intake to no more than 1 drink per day for nonpregnant women. One drink equals  12 ounces of beer, 5 ounces of wine, or 1 ounces of hard liquor.  Do not use street drugs.  Do not share needles.  Ask your health care provider for help if you need support or information about quitting drugs.  Tell your health care provider if you often feel depressed.  Tell your health care provider if you have ever been abused or do not feel safe at home. Document Released: 10/05/2010 Document Revised: 08/06/2013 Document Reviewed: 02/21/2013 [facility] Patient Information 2015 [place], [place]. This information is not intended to replace advice given to you by your health care provider. Make sure you discuss any questions you have with your health care provider.

## 2015-11-07 ENCOUNTER — Telehealth: Payer: Self-pay | Admitting: *Deleted

## 2015-11-07 DIAGNOSIS — R760 Raised antibody titer: Secondary | ICD-10-CM

## 2015-11-07 DIAGNOSIS — R768 Other specified abnormal immunological findings in serum: Secondary | ICD-10-CM

## 2015-11-07 LAB — URINALYSIS W MICROSCOPIC + REFLEX CULTURE
BACTERIA UA: NONE SEEN [HPF]
Bilirubin Urine: NEGATIVE
CASTS: NONE SEEN [LPF]
Crystals: NONE SEEN [HPF]
GLUCOSE, UA: NEGATIVE
HGB URINE DIPSTICK: NEGATIVE
KETONES UR: NEGATIVE
LEUKOCYTES UA: NEGATIVE
Nitrite: NEGATIVE
PROTEIN: NEGATIVE
RBC / HPF: NONE SEEN RBC/HPF (ref <=2)
Specific Gravity, Urine: 1.026 (ref 1.001–1.035)
WBC, UA: NONE SEEN WBC/HPF (ref <=5)
YEAST: NONE SEEN [HPF]
pH: 6.5 (ref 5.0–8.0)

## 2015-11-07 LAB — CBC WITH DIFFERENTIAL/PLATELET
Basophils Absolute: 0 cells/uL (ref 0–200)
Basophils Relative: 0 %
EOS PCT: 2 %
Eosinophils Absolute: 166 cells/uL (ref 15–500)
HCT: 40.7 % (ref 35.0–45.0)
HEMOGLOBIN: 13.3 g/dL (ref 11.7–15.5)
LYMPHS ABS: 1577 {cells}/uL (ref 850–3900)
Lymphocytes Relative: 19 %
MCH: 29.3 pg (ref 27.0–33.0)
MCHC: 32.7 g/dL (ref 32.0–36.0)
MCV: 89.6 fL (ref 80.0–100.0)
MONOS PCT: 8 %
MPV: 9.2 fL (ref 7.5–12.5)
Monocytes Absolute: 664 cells/uL (ref 200–950)
NEUTROS ABS: 5893 {cells}/uL (ref 1500–7800)
Neutrophils Relative %: 71 %
PLATELETS: 358 10*3/uL (ref 140–400)
RBC: 4.54 MIL/uL (ref 3.80–5.10)
RDW: 13.4 % (ref 11.0–15.0)
WBC: 8.3 10*3/uL (ref 3.8–10.8)

## 2015-11-07 LAB — LIPID PANEL
CHOLESTEROL: 212 mg/dL — AB (ref 125–200)
HDL: 63 mg/dL (ref >=46)
LDL Cholesterol: 121 mg/dL (ref <130)
TRIGLYCERIDES: 140 mg/dL (ref <150)
Total CHOL/HDL Ratio: 3.4 Ratio (ref <=5.0)
VLDL: 28 mg/dL (ref <30)

## 2015-11-07 LAB — PAP IG W/ RFLX HPV ASCU

## 2015-11-07 LAB — ANA: Anti Nuclear Antibody(ANA): NEGATIVE

## 2015-11-07 NOTE — Telephone Encounter (Signed)
Referral placed for each office in epic they will contact pt to schedule.

## 2015-11-07 NOTE — Telephone Encounter (Signed)
-----   Message from [name], MD sent at 11/06/2015  4:32 PM EDT ----- Patient needs to separate consults. #1 Dr. 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

## 2015-11-10 ENCOUNTER — Encounter: Payer: Self-pay | Admitting: Gynecology

## 2015-11-10 LAB — CARDIOLIPIN ANTIBODIES, IGG, IGM, IGA: Anticardiolipin IgA: <11 [APL'U]

## 2015-11-10 NOTE — Telephone Encounter (Signed)
#  1 appointment on 12/16/15 @ 8:15am with Dr.Gherge.

## 2015-11-11 ENCOUNTER — Other Ambulatory Visit: Payer: Self-pay | Admitting: Gynecology

## 2015-11-11 DIAGNOSIS — E7800 Pure hypercholesterolemia, unspecified: Secondary | ICD-10-CM

## 2015-11-11 LAB — RFX PTT-LA W/RFX TO HEX PHASE CONF: PTT-LA Screen: 35 s (ref <=40)

## 2015-11-11 LAB — RFX DRVVT SCR W/RFLX CONF 1:1 MIX: dRVVT Screen: 31 s (ref <=45)

## 2015-11-11 LAB — LUPUS ANTICOAGULANT PANEL

## 2015-11-13 ENCOUNTER — Other Ambulatory Visit: Payer: PRIVATE HEALTH INSURANCE

## 2015-11-13 DIAGNOSIS — E7800 Pure hypercholesterolemia, unspecified: Secondary | ICD-10-CM

## 2015-11-13 LAB — LIPID PANEL
CHOL/HDL RATIO: 3.4 ratio (ref <=5.0)
Cholesterol: 208 mg/dL — ABNORMAL HIGH (ref 125–200)
HDL: 62 mg/dL (ref >=46)
LDL CALC: 129 mg/dL (ref <130)
Triglycerides: 85 mg/dL (ref <150)
VLDL: 17 mg/dL (ref <30)

## 2015-11-14 ENCOUNTER — Other Ambulatory Visit: Payer: Self-pay | Admitting: *Deleted

## 2015-11-14 DIAGNOSIS — E7800 Pure hypercholesterolemia, unspecified: Secondary | ICD-10-CM

## 2015-11-14 NOTE — Telephone Encounter (Signed)
I was informed with hematologist that patient does not need to be seen there, they suggested rheumatology I relayed this to Dr.Fontaine as well and his response the below:    Tell patient that they said they did not need to see her but I would recommend following up with rheumatology.  I told pt this as well and she has one will follow up.

## 2015-12-16 ENCOUNTER — Ambulatory Visit (INDEPENDENT_AMBULATORY_CARE_PROVIDER_SITE_OTHER): Payer: PRIVATE HEALTH INSURANCE | Admitting: Internal Medicine

## 2015-12-16 ENCOUNTER — Encounter: Payer: Self-pay | Admitting: Internal Medicine

## 2015-12-16 DIAGNOSIS — E063 Autoimmune thyroiditis: Secondary | ICD-10-CM | POA: Insufficient documentation

## 2015-12-16 HISTORY — DX: Autoimmune thyroiditis: E06.3

## 2015-12-16 LAB — T3, FREE: T3, Free: 3.4 pg/mL (ref 2.3–4.2)

## 2015-12-16 LAB — T4, FREE: Free T4: 0.77 ng/dL (ref 0.60–1.60)

## 2015-12-16 LAB — TSH: TSH: 3.18 u[IU]/mL (ref 0.35–4.50)

## 2015-12-16 NOTE — Progress Notes (Signed)
Patient ID: Ashley Arellano, female   DOB: 19-Aug-1965, 50 y.o.   MRN: [id number]   HPI  Ashley Arellano is a 50 y.o.-year-old female, referred by her PCP, Dr.Little, for management of Hashimoto's thyroiditis.  Pt. has been dx with Hashimoto's thyroiditis hypothyroidism in 09/2015, during investigation for a rash - upper body - for 3 months (she has been getting the rash for 4 years). At that point, she had several positive antibody levels, including anticardiolipin IgM, and anti-MCV IgG, ANA IgG.  She is not on Levothyroxine yet as her TFTs have been normal, stable, so far.  I reviewed pt's thyroid tests:  09/16/2015: ATA <12, anti-TPO antibodies 42 (<35) 04/17/2015: TSH 2.4 Lab Results  Component Value Date   TSH 2.429 01/17/2015   TSH 2.813 01/02/2014  12/29/2010: TSH 2.73  Pt describes: - + weight gain (50 lbs in last 7 years) - + fatigue - + cold and heat intolerance - + depression - + constipation and diarrhea - IBS since 50 y/o. - + itching and dry skin on soles.  - + hair loss - worse - + weakness and pain in legs - + mental fog  She had a hysterectomy in 2010. No oophorectomy.  Pt denies feeling nodules in neck, hoarseness, + has Occasional dysphagia/no odynophagia, no SOB with lying down.  She has + FH of thyroid disorders in: Hashimoto's thyroiditis in both parents. No FH of thyroid cancer.  No h/o radiation tx to head or neck. She is [employer]. No recent use of iodine supplements.  Pt. also has a history of migraines, anemia.  ROS: Constitutional: + see HPI, + occasional nocturia Eyes: + blurry vision, no xerophthalmia ENT: no sore throat, no nodules palpated in throat, + Occasional dysphagia/no odynophagia, no hoarseness, + Occasional tinnitus Cardiovascular: no CP/+ palpitations/+ leg swelling Respiratory: no cough/+ SOB with exertion Gastrointestinal: no N/V/+ D/+ C Musculoskeletal: + Both: muscle/joint aches Skin: + Rash (See history of present illness), + itching, +  hair loss, + easy bruising Neurological: + Left hand tremors (not new)/no numbness/tingling/dizziness, + headache Psychiatric: no depression/anxiety  Past Medical History:  Diagnosis Date  . Allergy   . Endometriosis   . Galactorrhea 2003   BILATERALLY--NOW RESOLVED  . IBS (irritable bowel syndrome)   . 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 CERVICAL BIOPSY  W/ LOOP ELECTRODE EXCISION  1998   for mild dysplasia and HPV effect/completely excised  . PELVIC LAPAROSCOPY  10/2000   RIGHT OVARIAN   . TOTAL LAPAROSCOPIC HYSTERECTOMY, SLING  10/2008   leiomyomata, menorrhagia  . WISDOM TOOTH EXTRACTION     Social History   Social History  . Marital status: Divorced    Spouse name: N/A  . 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 Smoking status: Never Smoker  . Smokeless tobacco: Never Used  . Alcohol use 0.0 oz/week     Comment: Rare  . Drug use: No   Current Outpatient Prescriptions on File Prior to Visit  Medication Sig Dispense Refill  . ALBUTEROL IN Inhale into the lungs as needed.     . Cetirizine HCl (ZYRTEC PO) Take by mouth.    . diphenhydrAMINE (BENADRYL) 25 MG tablet Take 25 mg by mouth every 6 (six) hours as needed.    Marland Kitchen guaiFENesin (MUCINEX) 600 MG 12 hr tablet Take by mouth as needed.    Marland Kitchen  Naproxen Sodium (ALEVE PO) Take by mouth as needed.     Marland Kitchen. ZOLMitriptan (ZOMIG) 2.5 MG tablet Take 2.5 mg by mouth once. May repeat in 2 hours if headache persists or recurs.    Marland Kitchen. EPINEPHrine 0.3 mg/0.3 mL IJ SOAJ injection Inject into the muscle once.    . Montelukast Sodium (SINGULAIR PO) Take by mouth.    . RaNITidine HCl (ZANTAC PO) Take by mouth.     No current facility-administered medications on file prior to visit.    Allergies  Allergen Reactions  . Sunflower Oil Shortness Of Breath and Rash    Any kind of sunflower  . Penicillins    Family History   Problem Relation Age of Onset  . Ovarian cancer Mother   . Uterine cancer Mother   . Ovarian cancer Maternal Grandmother   . Uterine cancer Maternal Grandmother   . Hypertension Maternal Grandfather   . Stroke Maternal Grandfather   . Breast cancer Other     30's  . Spina bifida Cousin   . Lung cancer Father   . Rheum arthritis Father    PE: BP 114/78 (BP Location: Left Arm, Patient Position: Sitting)   Pulse 76   Ht 5\' 6"  (1.676 m)   Wt 181 lb (82.1 kg)   LMP 10/27/2008   SpO2 97%   BMI 29.21 kg/m  Wt Readings from Last 3 Encounters:  12/16/15 181 lb (82.1 kg)  11/06/15 181 lb (82.1 kg)  01/17/15 178 lb (80.7 kg)   Constitutional: overweight, in NAD Eyes: PERRLA, EOMI, no exophthalmos ENT: moist mucous membranes, no thyromegaly, no cervical lymphadenopathy Cardiovascular: RRR, No MRG Respiratory: CTA B Gastrointestinal: abdomen soft, NT, ND, BS+ Musculoskeletal: no deformities, strength intact in all 4 Skin: moist, warm, no rashes Neurological: + Mild tremor with outstretched left hand, DTR normal in all 4  ASSESSMENT: 1. Hashimoto thyroiditis  PLAN: 1. Hashimoto thyroiditis - I reviewed her thyroid results along  with the patient. The fact that the thyroid antibodies are positive is indicative of Hashimoto's thyroiditis.  Pt does not have a thyroid cancer family history or a personal history of RxTx to head/neck, so is not at high risk for [facility]. - we had a long discussion about her Hashimoto thyroiditis diagnosis. I explained that this is an autoimmune disorder, in which she develops antibodies against her own thyroid. The antibodies bind to the thyroid tissue and cause inflammation, and, eventually, destruction of the gland and hypothyroidism. We don't know how long this process can be, it can last from months to years. As of now, based on the last results that I have, her thyroid tests are normal. We will repeat them today, however. I will also add thyroid antibody  levels. - thyroid enlargement especially at the beginning of her Hashimoto thyroiditis course is not uncommon, and it has a waxing and waning character. She does have occasional dysphagia, which could be related to this process. - We discussed about treatment for Hashimoto thyroiditis, which is actually limited to thyroid hormones in case her TFTs are abnormal. She feels that her TSH is at the upper limit of normal, but I explained that this is not the case, and I would not suggest treatment for perfectly normal TSH. Also, it is extremely unlikely for her dramatic rash to be related to the mildly elevated TPO antibodies. I advised her that she may try selenium if her thyroid antibodies returned elevated. Selenium has been tried with various results, some showing improvement in the  TPO antibodies. However, there are no randomized controlled trials of this are consistent results between trials. We also discussed about ways to improve her immune system (relaxation, diet, exercise, sleep) to reduce the Ab titer and, subsequently, the thyroid inflammation. - We decided to check thyroid tests now and have her return in a year for a visit and in 6 months for lab recheck (she prefers to have the labs done at [facility] where she works, so I printed the orders for her today).  - I will send her the results through my chart when they become available  Component     Latest Ref Rng & Units 12/16/2015  TSH     0.35 - 4.50 uIU/mL 3.18  T4,Free(Direct)     0.60 - 1.60 ng/dL 1.61  Triiodothyronine,Free,Serum     2.3 - 4.2 pg/mL 3.4  Thyroglobulin Ab     <2 IU/mL <1  Thyroperoxidase Ab SerPl-aCnc     <9 IU/mL 55 (H)   TPO antibodies remain elevated. I would suggest to start a selenium supplement.  Carlus Pavlov, MD PhD [facility] Endocrinology

## 2015-12-16 NOTE — Patient Instructions (Addendum)
Please stop at the lab.  Please review the Hashimoto's thyroiditis handout.  If the thyroid antibodies are still elevated, I would suggest to start a selenium supplement.  Please come back for labs in 6 months and for another visit in 1 year.

## 2015-12-17 LAB — THYROID PEROXIDASE ANTIBODY: THYROID PEROXIDASE ANTIBODY: 55 [IU]/mL — AB (ref <9)

## 2015-12-17 LAB — THYROGLOBULIN ANTIBODY

## 2016-06-03 ENCOUNTER — Encounter: Payer: Self-pay | Admitting: Internal Medicine

## 2016-06-09 ENCOUNTER — Encounter: Payer: Self-pay | Admitting: Internal Medicine

## 2016-06-09 ENCOUNTER — Telehealth: Payer: Self-pay

## 2016-06-09 DIAGNOSIS — E063 Autoimmune thyroiditis: Secondary | ICD-10-CM

## 2016-06-09 NOTE — Progress Notes (Signed)
[facility], drawn on 06/03/2016: TSH 4.97 (0.5-4.67) Free T4 1.06 (0.78-2.19) Free T3 4.02 (2.77-5.27) I would suggest to repeat the tests in 1.5-2 months. No intervention needed for now. Fluctuating TFTs are common at the beginning of Hashimoto's thyroiditis diagnosis.

## 2016-06-09 NOTE — Telephone Encounter (Signed)
Called patient and advised of labs we received, need repeat of labs in 2 months. Patient requested to fax labs to her, so she could get them done at work.

## 2016-06-10 ENCOUNTER — Telehealth: Payer: Self-pay

## 2016-06-10 NOTE — Telephone Encounter (Signed)
Fax for labs submitted.

## 2016-06-14 ENCOUNTER — Other Ambulatory Visit: Payer: PRIVATE HEALTH INSURANCE

## 2016-07-16 ENCOUNTER — Encounter: Payer: Self-pay | Admitting: Internal Medicine

## 2016-07-19 ENCOUNTER — Encounter: Payer: Self-pay | Admitting: Internal Medicine

## 2016-07-19 NOTE — Progress Notes (Signed)
Received labs from [facility] laboratory, drawn on 07/16/2016: TSH 4.20 (0.5-4.67) Free T4 1.25 (0.78-2.19) Free T3 3.63 (2.77-5.27) TFTs now normal. No intervention needed for now. We can recheck her TFTs when she comes back in 5 months.

## 2016-08-18 ENCOUNTER — Encounter: Payer: Self-pay | Admitting: Gynecology

## 2016-11-08 ENCOUNTER — Encounter: Payer: PRIVATE HEALTH INSURANCE | Admitting: Gynecology

## 2016-11-23 ENCOUNTER — Ambulatory Visit (INDEPENDENT_AMBULATORY_CARE_PROVIDER_SITE_OTHER): Payer: Commercial Managed Care - PPO | Admitting: Gynecology

## 2016-11-23 ENCOUNTER — Encounter: Payer: Self-pay | Admitting: Gynecology

## 2016-11-23 VITALS — BP 116/74 | Ht 66.0 in | Wt 181.0 lb

## 2016-11-23 DIAGNOSIS — Z01419 Encounter for gynecological examination (general) (routine) without abnormal findings: Secondary | ICD-10-CM | POA: Diagnosis not present

## 2016-11-23 DIAGNOSIS — Z13220 Encounter for screening for lipoid disorders: Secondary | ICD-10-CM

## 2016-11-23 LAB — CBC WITH DIFFERENTIAL/PLATELET
Basophils Absolute: 0 cells/uL (ref 0–200)
Basophils Relative: 0 %
Eosinophils Absolute: 261 cells/uL (ref 15–500)
Eosinophils Relative: 3 %
HCT: 40.8 % (ref 35.0–45.0)
Hemoglobin: 13.6 g/dL (ref 11.7–15.5)
LYMPHS PCT: 25 %
Lymphs Abs: 2175 cells/uL (ref 850–3900)
MCH: 29.4 pg (ref 27.0–33.0)
MCHC: 33.3 g/dL (ref 32.0–36.0)
MCV: 88.3 fL (ref 80.0–100.0)
MONO ABS: 609 {cells}/uL (ref 200–950)
MONOS PCT: 7 %
MPV: 9.7 fL (ref 7.5–12.5)
NEUTROS ABS: 5655 {cells}/uL (ref 1500–7800)
Neutrophils Relative %: 65 %
PLATELETS: 330 10*3/uL (ref 140–400)
RBC: 4.62 MIL/uL (ref 3.80–5.10)
RDW: 13.5 % (ref 11.0–15.0)
WBC: 8.7 10*3/uL (ref 3.8–10.8)

## 2016-11-23 NOTE — Patient Instructions (Signed)
Follow up for your mammogram.  Schedule your screening colonoscopy sometime over this coming year.

## 2016-11-23 NOTE — Progress Notes (Signed)
    Ashley Arellano 04/02/1966 [id number]        51 y.o.  [id number] for annual exam.    Past medical history,surgical history, problem list, medications, allergies, family history and social history were all reviewed and documented as reviewed in the EPIC chart.  ROS:  Performed with pertinent positives and negatives included in the history, assessment and plan.   Additional significant findings :  None   Exam: Ashley Arellano assistant Vitals:   11/23/16 1404  BP: 116/74  Weight: 181 lb (82.1 kg)  Height: 5\' 6"  (1.676 m)   Body mass index is 29.21 kg/m.  General appearance:  Normal affect, orientation and appearance. Skin: Grossly normal HEENT: Without gross lesions.  No cervical or supraclavicular adenopathy. Thyroid normal.  Lungs:  Clear without wheezing, rales or rhonchi Cardiac: RR, without RMG Abdominal:  Soft, nontender, without masses, guarding, rebound, organomegaly or hernia Breasts:  Examined lying and sitting without masses, retractions, discharge or axillary adenopathy. Pelvic:  Ext, BUS, Vagina: Normal  Adnexa: Without masses or tenderness    Anus and perineum: Normal   Rectovaginal: Normal sphincter tone without palpated masses or tenderness.    Assessment/Plan:  51 y.o. [id number] female for annual exam, status post TLH 2010 for leiomyoma and menorrhagia.   1. Pap smear 2017. No Pap smear done today. 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 Options to stop screening per current screening guidelines based on hysterectomy history reviewed. Will readdress on annual basis. 2. Mammography due now. Patient was given a request slip and will schedule at [facility]. Breast exam normal today. 3. Colonoscopy never. Reviewed current screening guidelines and patient plans to schedule this coming year. 4. Health maintenance. Baseline CBC, CMP, lipid profile and urinalysis ordered. Follow up in one year, sooner as needed.   [name] MD, 2:47 PM  11/23/2016

## 2016-11-24 ENCOUNTER — Other Ambulatory Visit: Payer: Self-pay | Admitting: Gynecology

## 2016-11-24 DIAGNOSIS — R7309 Other abnormal glucose: Secondary | ICD-10-CM

## 2016-11-24 DIAGNOSIS — E7889 Other lipoprotein metabolism disorders: Secondary | ICD-10-CM

## 2016-11-24 LAB — URINALYSIS W MICROSCOPIC + REFLEX CULTURE
BACTERIA UA: NONE SEEN [HPF]
Bilirubin Urine: NEGATIVE
CASTS: NONE SEEN [LPF]
Crystals: NONE SEEN [HPF]
Glucose, UA: NEGATIVE
HGB URINE DIPSTICK: NEGATIVE
Ketones, ur: NEGATIVE
Leukocytes, UA: NEGATIVE
NITRITE: NEGATIVE
PH: 5.5 (ref 5.0–8.0)
Protein, ur: NEGATIVE
Specific Gravity, Urine: 1.019 (ref 1.001–1.035)
YEAST: NONE SEEN [HPF]

## 2016-11-24 LAB — LIPID PANEL
CHOL/HDL RATIO: 3.7 ratio (ref <5.0)
Cholesterol: 206 mg/dL — ABNORMAL HIGH (ref <200)
HDL: 55 mg/dL (ref >50)
LDL Cholesterol: 129 mg/dL — ABNORMAL HIGH (ref <100)
Triglycerides: 108 mg/dL (ref <150)
VLDL: 22 mg/dL (ref <30)

## 2016-11-24 LAB — COMPREHENSIVE METABOLIC PANEL
ALK PHOS: 62 U/L (ref 33–130)
ALT: 16 U/L (ref 6–29)
AST: 19 U/L (ref 10–35)
Albumin: 4.3 g/dL (ref 3.6–5.1)
BILIRUBIN TOTAL: 0.3 mg/dL (ref 0.2–1.2)
BUN: 14 mg/dL (ref 7–25)
CO2: 20 mmol/L (ref 20–32)
CREATININE: 0.88 mg/dL (ref 0.50–1.05)
Calcium: 9.6 mg/dL (ref 8.6–10.4)
Chloride: 105 mmol/L (ref 98–110)
GLUCOSE: 100 mg/dL — AB (ref 65–99)
Potassium: 4 mmol/L (ref 3.5–5.3)
SODIUM: 138 mmol/L (ref 135–146)
TOTAL PROTEIN: 7.2 g/dL (ref 6.1–8.1)

## 2016-11-25 ENCOUNTER — Other Ambulatory Visit: Payer: Commercial Managed Care - PPO

## 2016-11-25 DIAGNOSIS — R7309 Other abnormal glucose: Secondary | ICD-10-CM

## 2016-11-25 DIAGNOSIS — E7889 Other lipoprotein metabolism disorders: Secondary | ICD-10-CM

## 2016-11-25 LAB — LIPID PANEL
Cholesterol: 212 mg/dL — ABNORMAL HIGH (ref <200)
HDL: 61 mg/dL (ref >50)
LDL Cholesterol: 132 mg/dL — ABNORMAL HIGH (ref <100)
TRIGLYCERIDES: 94 mg/dL (ref <150)
Total CHOL/HDL Ratio: 3.5 Ratio (ref <5.0)
VLDL: 19 mg/dL (ref <30)

## 2016-11-25 LAB — URINE CULTURE: Organism ID, Bacteria: NO GROWTH

## 2016-11-25 LAB — GLUCOSE, RANDOM: Glucose, Bld: 95 mg/dL (ref 65–99)

## 2016-12-14 ENCOUNTER — Encounter: Payer: Self-pay | Admitting: Gynecology

## 2016-12-16 ENCOUNTER — Encounter: Payer: Self-pay | Admitting: Internal Medicine

## 2016-12-16 ENCOUNTER — Ambulatory Visit (INDEPENDENT_AMBULATORY_CARE_PROVIDER_SITE_OTHER): Payer: Commercial Managed Care - PPO | Admitting: Internal Medicine

## 2016-12-16 VITALS — BP 122/82 | HR 74 | Wt 179.0 lb

## 2016-12-16 DIAGNOSIS — R1310 Dysphagia, unspecified: Secondary | ICD-10-CM | POA: Diagnosis not present

## 2016-12-16 DIAGNOSIS — E063 Autoimmune thyroiditis: Secondary | ICD-10-CM | POA: Diagnosis not present

## 2016-12-16 HISTORY — DX: Dysphagia, unspecified: R13.10

## 2016-12-16 NOTE — Patient Instructions (Addendum)
Please stop at the lab.  If we need to start Levothyroxine, Take the thyroid hormone every day, with water, at least 30 minutes before breakfast, separated by at least 4 hours from: - acid reflux medications - calcium - iron - multivitamins  Please come back for labs in 1.5 mo if we end up starting Levothyroxine; if not, RTC in 1 year.

## 2016-12-16 NOTE — Progress Notes (Signed)
Patient ID: Ashley Arellano, female   DOB: June 18, 1965, 51 y.o.   MRN: [id number]   HPI  Ashley Arellano is a 51 y.o.-year-old female, initially referred by her PCP, Dr.Little, returning for follow-up for Hashimoto's thyroiditis. Last visit 1 year ago.  Reviewed and addended history: Pt. has been dx with Hashimoto's thyroiditis hypothyroidism in 09/2015, during investigation for a rash - upper body - for 3 months (she has been getting the rash for 4 years). At that point, she had several positive antibody levels, including anticardiolipin IgM, and anti-MCV IgG, ANA IgG.  She is not on levothyroxine yet as TFTs have stabilized.  I reviewed pt's thyroid tests:  Received labs from [facility] laboratory, drawn on 07/16/2016: TSH 4.20 (0.5-4.67) Free T4 1.25 (0.78-2.19) Free T3 3.63 (2.77-5.27) TFTs now normal. No intervention needed for now. We can recheck her TFTs when she comes back in 5 months.  Received labs from [facility] laboratory, drawn on 06/03/2016: TSH 4.97 (0.5-4.67) Free T4 1.06 (0.78-2.19) Free T3 4.02 (2.77-5.27) I would suggest to repeat the tests in 1.5-2 months. No intervention needed for now. Fluctuating TFTs are common at the beginning of Hashimoto's thyroiditis diagnosis.  Lab Results  Component Value Date   TSH 3.18 12/16/2015   TSH 2.429 01/17/2015   TSH 2.813 01/02/2014   FREET4 0.77 12/16/2015   Component     Latest Ref Rng & Units 12/16/2015  Thyroglobulin Ab     <2 IU/mL <1  Thyroperoxidase Ab SerPl-aCnc     <9 IU/mL 55 (H)   09/16/2015: ATA <12, anti-TPO antibodies 42 (<35) 04/17/2015: TSH 2.4 12/29/2010: TSH 2.73  Pt describes: - + more palpitations lately  She also describes fatigue, feeling cold, both constipation and diarrhea (IBS since 51 years old), dry skin, hair loss, mental fog, muscle and joint aches. These have not improved since last visit. She also has chronic tremors (left> right).  Pt denies: - feeling nodules in neck - hoarseness -  choking - SOB with lying down She has occasional dysphagia.  She has + FH of thyroid disorders in: Hashimoto's thyroiditis in both parents. No FH of thyroid cancer. No h/o radiation tx to head or neck.  No seaweed or kelp. No recent contrast studies. No herbal supplements. No Biotin use. No recent steroids use.   Pt. also has a history of migraines, anemia. She had a hysterectomy in 2010. No oophorectomy.  ROS: Constitutional: no weight gain/no weight loss, + see HPI Eyes: + blurry vision, no xerophthalmia ENT: no sore throat, + see HPI Cardiovascular: no CP/no SOB/+ palpitations/no leg swelling Respiratory: no cough/no SOB/no wheezing Gastrointestinal: no N/no V/no D/no C/no acid reflux Musculoskeletal: no muscle aches/no joint aches Skin: no rashes, + hair loss Neurological: + tremors/no numbness/no tingling/no dizziness  I reviewed pt's medications, allergies, PMH, social hx, family hx, and changes were documented in the history of present illness. Otherwise, unchanged from my initial visit note.  Past Medical History:  Diagnosis Date  . Allergy   . Endometriosis   . Galactorrhea 2003   BILATERALLY--NOW RESOLVED  . IBS (irritable bowel syndrome)   . 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 Thyroid disease    Hashimotos   Past Surgical History:  Procedure Laterality Date  . CERVICAL BIOPSY  W/ LOOP ELECTRODE EXCISION  1998   for mild dysplasia and HPV effect/completely excised  . PELVIC LAPAROSCOPY  10/2000   RIGHT OVARIAN   . TOTAL LAPAROSCOPIC HYSTERECTOMY, SLING  10/2008  leiomyomata, menorrhagia  . WISDOM TOOTH EXTRACTION     Social History   Social History  . Marital status: Divorced    Spouse name: N/A  . 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 Smoking status: Never Smoker  . Smokeless tobacco: Never Used  . Alcohol use 0.0 oz/week     Comment: Rare  . Drug use: No   Current Outpatient  Prescriptions on File Prior to Visit  Medication Sig Dispense Refill  . ALBUTEROL IN Inhale into the lungs as needed.     . Cetirizine HCl (ZYRTEC PO) Take by mouth.    . diphenhydrAMINE (BENADRYL) 25 MG tablet Take 25 mg by mouth every 6 (six) hours as needed.    Marland Kitchen EPINEPHrine 0.3 mg/0.3 mL IJ SOAJ injection Inject into the muscle once.    Marland Kitchen guaiFENesin (MUCINEX) 600 MG 12 hr tablet Take by mouth as needed.    . metroNIDAZOLE (METROGEL) 0.75 % gel Apply once daily to affected areas on the face    . Naproxen Sodium (ALEVE PO) Take by mouth as needed.     . RaNITidine HCl (ZANTAC PO) Take by mouth.    Marland Kitchen ZOLMitriptan (ZOMIG) 2.5 MG tablet Take 2.5 mg by mouth once. May repeat in 2 hours if headache persists or recurs.     No current facility-administered medications on file prior to visit.    Allergies  Allergen Reactions  . Sunflower Oil Shortness Of Breath and Rash    Any kind of sunflower  . Penicillins     Family reaction   Family History  Problem Relation Age of Onset  . Ovarian cancer Mother   . Uterine cancer Mother   . Ovarian cancer Maternal Grandmother   . Uterine cancer Maternal Grandmother   . Hypertension Maternal Grandfather   . Stroke Maternal Grandfather   . Breast cancer Other        30's  . Spina bifida Cousin   . Lung cancer Father   . Rheum arthritis Father   . Multiple sclerosis Father    PE: BP 122/82 (BP Location: Left Arm, Patient Position: Sitting)   Pulse 74   Wt 179 lb (81.2 kg)   LMP 10/27/2008   SpO2 97%   BMI 28.89 kg/m  Wt Readings from Last 3 Encounters:  12/16/16 179 lb (81.2 kg)  11/23/16 181 lb (82.1 kg)  12/16/15 181 lb (82.1 kg)   Constitutional: overweight, in NAD Eyes: PERRLA, EOMI, no exophthalmos ENT: moist mucous membranes, no thyromegaly, no cervical lymphadenopathy Cardiovascular: RRR, No MRG Respiratory: CTA B Gastrointestinal: abdomen soft, NT, ND, BS+ Musculoskeletal: no deformities, strength intact in all 4 Skin:  moist, warm, no rashes Neurological: + tremor with outstretched hands, DTR normal in all 4  ASSESSMENT: 1. Hashimoto thyroiditis  2. Dysphagia  PLAN: 1. Hashimoto thyroiditis - we reviewed together the TFTs and Thyroid Ab's obtained at last visit. The TFTs are normal, but Abs are high, indicative of Hashimoto's thyroiditis. - we again discussed about the fact that Hashimoto thyroiditis  is an autoimmune disorder with progressive destruction of her thyroid gland, however, at this time, her TFTs are still normal, but the TSH is at the ULN. We discussed that we can try a low dose LT4 as she had possible hypothyroid sxs. We will first check TFTs today and if the TSH is still close to the ULN, we can start LT4 25 mcg daily,. -  We discussed how to take the levothyroxine in case we need to start: every day, with water, at least 30 minutes before breakfast, separated by at least 4 hours from: - acid reflux medications - calcium - iron - multivitamins - I will see her back in 1 year  2. Dysphagia - intermittent - I reassured the pt that thyroid enlargement especially at the beginning of her Hashimoto thyroiditis course is not uncommon, and it has a waxing and waning character. She does have occasional dysphagia, which could be related to this process.  [facility]: 12/21/2016: TSH 2.77, free T4 1.23, both normal. TPO antibodies 63 (0-34)  No need to start levothyroxine for now.  Carlus Pavlovristina Elienai Gailey, MD PhD [facility]

## 2016-12-21 ENCOUNTER — Encounter: Payer: Self-pay | Admitting: Internal Medicine

## 2016-12-23 ENCOUNTER — Encounter: Payer: Self-pay | Admitting: Internal Medicine

## 2017-05-30 ENCOUNTER — Telehealth: Payer: Self-pay | Admitting: Internal Medicine

## 2017-05-30 NOTE — Telephone Encounter (Signed)
Patient needs labs done to check levels.  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  can we fax the orders over for her   [phone]

## 2017-05-30 NOTE — Addendum Note (Signed)
Addended by: Yolande JollyLAWSON, Kjell Brannen on: 05/30/2017 03:29 PM   Modules accepted: Orders

## 2017-05-30 NOTE — Addendum Note (Signed)
Addended by: Jaquelynn Wanamaker on: 05/30/2017 03:29 PM   Modules accepted: Orders  

## 2017-05-30 NOTE — Telephone Encounter (Signed)
Faxed for pt, called to verify resulting agency and she stated it did not matter

## 2017-05-30 NOTE — Addendum Note (Signed)
Addended by: Deshannon Hinchliffe on: 05/30/2017 03:29 PM   Modules accepted: Orders  

## 2017-06-07 ENCOUNTER — Encounter: Payer: Self-pay | Admitting: Internal Medicine

## 2017-06-08 ENCOUNTER — Encounter: Payer: Self-pay | Admitting: Internal Medicine

## 2017-06-08 NOTE — Progress Notes (Signed)
Received labs from [facility], drawn on 06/07/2017: -TSH 2.56, free T4 1.10, free T3 3.5, all normal

## 2017-12-19 ENCOUNTER — Encounter: Payer: Self-pay | Admitting: Internal Medicine

## 2017-12-19 ENCOUNTER — Ambulatory Visit (INDEPENDENT_AMBULATORY_CARE_PROVIDER_SITE_OTHER): Payer: Commercial Managed Care - PPO | Admitting: Internal Medicine

## 2017-12-19 VITALS — BP 124/80 | HR 75 | Ht 66.0 in | Wt 182.0 lb

## 2017-12-19 DIAGNOSIS — E063 Autoimmune thyroiditis: Secondary | ICD-10-CM | POA: Diagnosis not present

## 2017-12-19 DIAGNOSIS — R1310 Dysphagia, unspecified: Secondary | ICD-10-CM | POA: Diagnosis not present

## 2017-12-19 LAB — T3, FREE: T3 FREE: 3.2 pg/mL (ref 2.3–4.2)

## 2017-12-19 LAB — TSH: TSH: 2.64 u[IU]/mL (ref 0.35–4.50)

## 2017-12-19 LAB — T4, FREE: FREE T4: 0.93 ng/dL (ref 0.60–1.60)

## 2017-12-19 NOTE — Progress Notes (Addendum)
Patient ID: Ashley Arellano, female   DOB: Apr 13, 1965, 52 y.o.   MRN: [id number]   HPI  Ashley Arellano is a 52 y.o.-year-old female, initially referred by her PCP, Dr.Little, returning for follow-up for Hashimoto's thyroiditis. Last visit 1 year ago.  Mother recently passed away 2/2 a massive AMI.  Since last visit, she started Manuka honey. She is also on black seed (Cumin) oil. She feels much better: less mm aches, less fatigue, has more energy, sleeps better.  Reviewed and addended history: Pt. has been dx with Hashimoto's thyroiditis hypothyroidism in 09/2015, during investigation for a rash - upper body - for 3 months (she has been getting the rash for 4 years). At that point, she had several positive antibody levels, including anticardiolipin IgM, and anti-MCV IgG, ANA IgG.  She is not on levothyroxine yet, as her TFTs have stabilized in the normal range.  Reviewed patient's TFTs: Received labs from [facility]: 06/07/2017: TSH 2.56, free T4 1.10, free T3 3.5, all normal  Received labs from [facility]: 12/21/2016: TSH 2.77, free T4 1.23, both normal. TPO antibodies 63 (0-34)   Received labs from [facility] laboratory, drawn on 07/16/2016: TSH 4.20 (0.5-4.67) Free T4 1.25 (0.78-2.19) Free T3 3.63 (2.77-5.27) TFTs now normal. No intervention needed for now. We can recheck her TFTs when she comes back in 5 months.  Received labs from [facility] laboratory, drawn on 06/03/2016: TSH 4.97 (0.5-4.67) Free T4 1.06 (0.78-2.19) Free T3 4.02 (2.77-5.27) I would suggest to repeat the tests in 1.5-2 months. No intervention needed for now. Fluctuating TFTs are common at the beginning of Hashimoto's thyroiditis diagnosis.  Lab Results  Component Value Date   TSH 3.18 12/16/2015   TSH 2.429 01/17/2015   TSH 2.813 01/02/2014   FREET4 0.77 12/16/2015   Her TPO antibodies were elevated: Component     Latest Ref Rng & Units 12/16/2015  Thyroglobulin Ab     <2 IU/mL <1   Thyroperoxidase Ab SerPl-aCnc     <9 IU/mL 55 (H)   09/16/2015: ATA <12, anti-TPO antibodies 42 (<35) 04/17/2015: TSH 2.4 12/29/2010: TSH 2.73  Pt denies: - feeling nodules in neck - hoarseness - choking - SOB with lying down But has occasional: - dysphagia, which is not new.  She has + FH of thyroid disorders in: Hashimoto's thyroiditis in both parents. No FH of thyroid cancer. No h/o radiation tx to head or neck.  No seaweed or kelp. No recent contrast studies. No herbal supplements. No Biotin use. No recent steroids use.   Pt. also has a history of migraines, anemia. She had a hysterectomy in 2010. No oophorectomy.  ROS: Constitutional: no weight gain/no weight loss, + fatigue, no subjective hyperthermia, = subjective hypothermia Eyes: no blurry vision, no xerophthalmia ENT: no sore throat, + see HPI Cardiovascular: no CP/no SOB/+ palpitations/no leg swelling Respiratory: no cough/no SOB/no wheezing Gastrointestinal: + N/no V/+ D/+ C/no acid reflux Musculoskeletal: no muscle aches/no joint aches Skin: no rashes, no hair loss Neurological: no tremors/no numbness/no tingling/no dizziness  I reviewed pt's medications, allergies, PMH, social hx, family hx, and changes were documented in the history of present illness. Otherwise, unchanged from my initial visit note.  Past Medical History:  Diagnosis Date  . Allergy   . Endometriosis   . Galactorrhea 2003   BILATERALLY--NOW RESOLVED  . IBS (irritable bowel syndrome)   . 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 Thyroid disease    Hashimotos   Past Surgical  History:  Procedure Laterality Date  . CERVICAL BIOPSY  W/ LOOP ELECTRODE EXCISION  1998   for mild dysplasia and HPV effect/completely excised  . PELVIC LAPAROSCOPY  10/2000   RIGHT OVARIAN   . TOTAL LAPAROSCOPIC HYSTERECTOMY, SLING  10/2008   leiomyomata, menorrhagia  . WISDOM TOOTH EXTRACTION     Social History   Social History  . Marital status:  Divorced    Spouse name: N/A  . 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 Smoking status: Never Smoker  . Smokeless tobacco: Never Used  . Alcohol use 0.0 oz/week     Comment: Rare  . Drug use: No   Current Outpatient Medications on File Prior to Visit  Medication Sig Dispense Refill  . ALBUTEROL IN Inhale into the lungs as needed.     . Cetirizine HCl (ZYRTEC PO) Take by mouth.    . diphenhydrAMINE (BENADRYL) 25 MG tablet Take 25 mg by mouth every 6 (six) hours as needed.    Marland Kitchen EPINEPHrine 0.3 mg/0.3 mL IJ SOAJ injection Inject into the muscle once.    Marland Kitchen guaiFENesin (MUCINEX) 600 MG 12 hr tablet Take by mouth as needed.    . metroNIDAZOLE (METROGEL) 0.75 % gel Apply once daily to affected areas on the face    . Naproxen Sodium (ALEVE PO) Take by mouth as needed.     . RaNITidine HCl (ZANTAC PO) Take by mouth.    Marland Kitchen ZOLMitriptan (ZOMIG) 2.5 MG tablet Take 2.5 mg by mouth once. May repeat in 2 hours if headache persists or recurs.     No current facility-administered medications on file prior to visit.    Allergies  Allergen Reactions  . Sunflower Oil Shortness Of Breath and Rash    Any kind of sunflower  . Penicillins     Family reaction   Family History  Problem Relation Age of Onset  . Ovarian cancer Mother   . Uterine cancer Mother   . Ovarian cancer Maternal Grandmother   . Uterine cancer Maternal Grandmother   . Hypertension Maternal Grandfather   . Stroke Maternal Grandfather   . Breast cancer Other        30's  . Spina bifida Cousin   . Lung cancer Father   . Rheum arthritis Father   . Multiple sclerosis Father    PE: BP 124/80   Pulse 75   Ht 5\' 6"  (1.676 m)   Wt 182 lb (82.6 kg)   LMP 10/27/2008   SpO2 94%   BMI 29.38 kg/m  Wt Readings from Last 3 Encounters:  12/19/17 182 lb (82.6 kg)  12/16/16 179 lb (81.2 kg)  11/23/16 181 lb (82.1 kg)   Constitutional: overweight, in NAD Eyes: PERRLA, EOMI, no  exophthalmos ENT: moist mucous membranes, no thyromegaly, no cervical lymphadenopathy Cardiovascular: RRR, No MRG Respiratory: CTA B Gastrointestinal: abdomen soft, NT, ND, BS+ Musculoskeletal: no deformities, strength intact in all 4 Skin: moist, warm, no rashes Neurological: + tremor with outstretched hands, DTR normal in all 4  ASSESSMENT: 1. Hashimoto thyroiditis  2. Dysphagia  3. Fatigue  PLAN: 1. Hashimoto thyroiditis -We reviewed together the latest TFTs from 06/2017 and also her previous thyroid antibodies.  The TFTs remain normal but the antibodies are high, indicative of Hashimoto's thyroiditis.  -We discussed about the fact that Hashimoto's thyroiditis is a progressive autoimmune disorder in which the thyroid gland slowly becomes underactive.  At this visit,  we will check her TPO antibody level, especially since she has started Manuka honey, which is anti-inflammatory. - As of now, she does not require levothyroxine, however, if the TSH starts climbing, we can start levothyroxine 25 mcg daily.  We again discussed how to take levothyroxine, in case we need to start: - in am - fasting - at least 30 min from b'fast - At least 4 hours from Ca, Fe, MVI, PPIs - We will check her TFTs today - I will see her back in 1 year  2. Dysphagia -  intermittent, not increased - We again discussed that thyroid enlargement especially in the setting of Hashimoto's thyroiditis is common and it has a Engineer, petroleumwaxing/waning character.  3. Fatigue - She had fatigue associated with muscle aches and insomnia before, all improved after she started the Manuka honey and also cumin seeds. Component     Latest Ref Rng & Units 12/19/2017  TSH     0.35 - 4.50 uIU/mL 2.64  T4,Free(Direct)     0.60 - 1.60 ng/dL 1.610.93  Triiodothyronine,Free,Serum     2.3 - 4.2 pg/mL 3.2  Thyroperoxidase Ab SerPl-aCnc     <9 IU/mL 160 (H)  Normal TFTs. TPO Abs a little higher.  I will suggest to start selenium 200 mcg daily  to help with decreasing her TPO antibodies.  Carlus Pavlovristina Gryphon Vanderveen, MD PhD [facility]

## 2017-12-19 NOTE — Patient Instructions (Signed)
Please stop at the lab.  If we need to start Levothyroxine, Take the thyroid hormone every day, with water, at least 30 minutes before breakfast, separated by at least 4 hours from: - acid reflux medications - calcium - iron - multivitamins  Please come back for labs in 1.5 mo if we end up starting Levothyroxine; if not, RTC in 1 year.

## 2017-12-20 LAB — THYROID PEROXIDASE ANTIBODY: THYROID PEROXIDASE ANTIBODY: 160 [IU]/mL — AB (ref <9)

## 2018-09-07 ENCOUNTER — Encounter: Payer: Self-pay | Admitting: Internal Medicine

## 2018-09-07 NOTE — Progress Notes (Unsigned)
Received labs faxed by patient.  She has been in the emergency room at [facility] with high blood pressure (172/118).  The blood pressure came down by itself.  Labs were checked and her TFTs were normal: 09/04/2018: TSH 2.91, free T4 0.94, free T3 3.75.  Glucose was 120.

## 2018-09-08 ENCOUNTER — Ambulatory Visit (INDEPENDENT_AMBULATORY_CARE_PROVIDER_SITE_OTHER): Payer: Commercial Managed Care - PPO | Admitting: Cardiology

## 2018-09-08 ENCOUNTER — Other Ambulatory Visit: Payer: Self-pay

## 2018-09-08 ENCOUNTER — Encounter: Payer: Self-pay | Admitting: Cardiology

## 2018-09-08 VITALS — BP 130/82 | HR 97 | Ht 66.0 in | Wt 185.0 lb

## 2018-09-08 DIAGNOSIS — E063 Autoimmune thyroiditis: Secondary | ICD-10-CM | POA: Diagnosis not present

## 2018-09-08 DIAGNOSIS — I10 Essential (primary) hypertension: Secondary | ICD-10-CM

## 2018-09-08 HISTORY — DX: Essential (primary) hypertension: I10

## 2018-09-08 MED ORDER — FUROSEMIDE 20 MG PO TABS: 20.0000 mg | ORAL_TABLET | Freq: Every day | ORAL | 1 refills | Status: DC

## 2018-09-08 NOTE — Patient Instructions (Signed)
Medication Instructions:  Your physician recommends that you continue on your current medications as directed. Please refer to the Current Medication list given to you today.  If you need a refill on your cardiac medications before your next appointment, please call your pharmacy.   Lab work: None.  If you have labs (blood work) drawn today and your tests are completely normal, you will receive your results only by: Marland Kitchen MyChart Message (if you have MyChart) OR . A paper copy in the mail If you have any lab test that is abnormal or we need to change your treatment, we will call you to review the results.  Testing/Procedures: Your physician has requested that you have an echocardiogram. Echocardiography is a painless test that uses sound waves to create images of your heart. It provides your doctor with information about the size and shape of your heart and how well your heart's chambers and valves are working. This procedure takes approximately one hour. There are no restrictions for this procedure.    Follow-Up: At [facility], you and your health needs are our priority.  As part of our continuing mission to provide you with exceptional heart care, we have created designated Provider Care Teams.  These Care Teams include your primary Cardiologist (physician) and Advanced Practice Providers (APPs -  Physician Assistants and Nurse Practitioners) who all work together to provide you with the care you need, when you need it. You will need a follow up appointment in 3 weeks.  Please call our office 2 months in advance to schedule this appointment.  You may see No primary care provider on file. or another member of our [employer] Provider Team in [place]: Norman Herrlich, MD . Belva Crome, MD  Any Other Special Instructions Will Be Listed Below (If Applicable).   Echocardiogram An echocardiogram is a procedure that uses painless sound waves (ultrasound) to produce an image of the heart.  Images from an echocardiogram can provide important information about:  Signs of coronary artery disease (CAD).  Aneurysm detection. An aneurysm is a weak or damaged part of an artery wall that bulges out from the normal force of blood pumping through the body.  Heart size and shape. Changes in the size or shape of the heart can be associated with certain conditions, including heart failure, aneurysm, and CAD.  Heart muscle function.  Heart valve function.  Signs of a past heart attack.  Fluid buildup around the heart.  Thickening of the heart muscle.  A tumor or infectious growth around the heart valves. Tell a health care provider about:  Any allergies you have.  All medicines you are taking, including vitamins, herbs, eye drops, creams, and over-the-counter medicines.  Any blood disorders you have.  Any surgeries you have had.  Any medical conditions you have.  Whether you are pregnant or may be pregnant. What are the risks? Generally, this is a safe procedure. However, problems may occur, including:  Allergic reaction to dye (contrast) that may be used during the procedure. What happens before the procedure? No specific preparation is needed. You may eat and drink normally. What happens during the procedure?   An IV tube may be inserted into one of your veins.  You may receive contrast through this tube. A contrast is an injection that improves the quality of the pictures from your heart.  A gel will be applied to your chest.  A wand-like tool (transducer) will be moved over your chest. The gel will help to  transmit the sound waves from the transducer.  The sound waves will harmlessly bounce off of your heart to allow the heart images to be captured in real-time motion. The images will be recorded on a computer. The procedure may vary among health care providers and hospitals. What happens after the procedure?  You may return to your normal, everyday life,  including diet, activities, and medicines, unless your health care provider tells you not to do that. Summary  An echocardiogram is a procedure that uses painless sound waves (ultrasound) to produce an image of the heart.  Images from an echocardiogram can provide important information about the size and shape of your heart, heart muscle function, heart valve function, and fluid buildup around your heart.  You do not need to do anything to prepare before this procedure. You may eat and drink normally.  After the echocardiogram is completed, you may return to your normal, everyday life, unless your health care provider tells you not to do that. This information is not intended to replace advice given to you by your health care provider. Make sure you discuss any questions you have with your health care provider. Document Released: 03/19/2000 Document Revised: 04/24/2016 Document Reviewed: 04/24/2016 Elsevier Interactive Patient Education  2019 Elsevier Inc.    

## 2018-09-08 NOTE — Progress Notes (Signed)
Cardiology Consultation:    Date:  09/08/2018   ID:  Ashley Arellano, DOB [birth year], MRN [id number]  PCP:  Catha Gosselin, MD  Cardiologist:  Gypsy Balsam, MD   Referring MD: Catha Gosselin, MD   Chief Complaint  Patient presents with  . Blood pressure problems  I have problem with my blood pressure  History of Present Illness:    Ashley Arellano is a 53 y.o. female who is being seen today for the evaluation of essential hypertension at the request of Little, Caryn Bee, MD.  About a week ago she was driving a car and started having some visual disturbances.  She described to see some bright spots in the upper visual fields.  She eventually ended up coming to the emergency room after she checked her blood pressure and noted her blood pressure being very elevated.  Quite extensive evaluation has been done in the emergency room she was referred to ophthalmologist and she left emergency room.  Ophthalmology evaluation did not show any acute changes.  She does have elevated blood pressure she started checking her blood pressure on a regular basis and usual numbers are between 1 40-1 60 systolic.  Obviously she is very concerned about it.  Also described not feeling well no specific symptoms stress not right she described primary increased body size.  Also just simply uncomfortable.  Denies have any chest pain tightness squeezing pressure burning chest but noted some increase in shortness of breath.  EKG done in the emergency room I do not have graft but I have description of it described possibility of anterior wall myocardial infarction as well as low voltage QRS  Past Medical History:  Diagnosis Date  . Allergy   . Endometriosis   . Galactorrhea 2003   BILATERALLY--NOW RESOLVED  . IBS (irritable bowel syndrome)   . 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 Thyroid disease    Hashimotos    Past Surgical History:  Procedure Laterality Date  . CERVICAL BIOPSY  W/ LOOP ELECTRODE EXCISION  1998   for mild dysplasia and HPV effect/completely excised  . PELVIC LAPAROSCOPY  10/2000   RIGHT OVARIAN   . TOTAL LAPAROSCOPIC HYSTERECTOMY, SLING  10/2008   leiomyomata, menorrhagia  . WISDOM TOOTH EXTRACTION      Current Medications: Current Meds  Medication Sig  . ALBUTEROL IN Inhale into the lungs as needed.   . Cetirizine HCl (ZYRTEC PO) Take by mouth.  . diphenhydrAMINE (BENADRYL) 25 MG tablet Take 25 mg by mouth every 6 (six) hours as needed.  Marland Kitchen EPINEPHrine 0.3 mg/0.3 mL IJ SOAJ injection Inject into the muscle once.  . Naproxen Sodium (ALEVE PO) Take by mouth as needed.   Marland Kitchen ZOLMitriptan (ZOMIG) 2.5 MG tablet Take 2.5 mg by mouth once. May repeat in 2 hours if headache persists or recurs.     Allergies:   Sunflower oil and Penicillins   Social History   Socioeconomic History  . Marital status: Married    Spouse name: Not on file  . Number of children: Not on file  . Years of education: Not on file  . Highest education level: Not on file  Occupational History  . Not on file  Social Needs  . Financial resource strain: Not on file  . Food insecurity:    Worry: Not on file    Inability: Not on file  . Transportation needs:    Medical: Not on file    Non-medical: Not on file  Tobacco  Use  . Smoking status: Never Smoker  . Smokeless tobacco: Never Used  Substance and Sexual Activity  . Alcohol use: Yes    Alcohol/week: 0.0 standard drinks    Comment: Rare  . Drug use: No  . Sexual activity: Never    Birth control/protection: Surgical    Comment: 1st intercourse 53 yo-Fewer than 5 partners-HYST  Lifestyle  . Physical activity:    Days per week: Not on file    Minutes per session: Not on file  . Stress: Not on file  Relationships  . Social connections:    Talks on phone: Not on file    Gets together: Not on file    Attends religious service: Not on file    Active member of club or organization: Not on file    Attends meetings of clubs or organizations: Not on  file    Relationship status: Not on file  Other Topics Concern  . Not on file  Social History Narrative  . Not on file     Family History: The patient's family history includes Breast cancer in an other family member; Hypertension in her maternal grandfather; Lung cancer in her father; Multiple sclerosis in her father; Ovarian cancer in her maternal grandmother and mother; Rheum arthritis in her father; Spina bifida in her cousin; Stroke in her maternal grandfather; Uterine cancer in her maternal grandmother and mother. ROS:   Please see the history of present illness.    All 14 point review of systems negative except as described per history of present illness.  EKGs/Labs/Other Studies Reviewed:    The following studies were reviewed today: Described by the emergency room physician is normal sinus rhythm normal P interval low voltage with possibility of anterior septal wall myocardial infarction.  I do not have original of this EKG    Recent Labs: 12/19/2017: TSH 2.64  Recent Lipid Panel    Component Value Date/Time   CHOL 212 (H) 11/25/2016 0910   TRIG 94 11/25/2016 0910   HDL 61 11/25/2016 0910   CHOLHDL 3.5 11/25/2016 0910   VLDL 19 11/25/2016 0910   LDLCALC 132 (H) 11/25/2016 0910    Physical Exam:    VS:  BP 130/82   Pulse 97   Ht  (1.676 m)   Wt 185 lb (83.9 kg)   LMP 10/27/2008   SpO2 99%   BMI 29.86 kg/m     Wt Readings from Last 3 Encounters:  09/08/18 185 lb (83.9 kg)  12/19/17 182 lb (82.6 kg)  12/16/16 179 lb (81.2 kg)     GEN:  Well nourished, well developed in no acute distress HEENT: Normal NECK: No JVD; No carotid bruits LYMPHATICS: No lymphadenopathy CARDIAC: RRR, no murmurs, no rubs, no gallops RESPIRATORY:  Clear to auscultation without rales, wheezing or rhonchi  ABDOMEN: Soft, non-tender, non-distended MUSCULOSKELETAL:  No edema; No deformity  SKIN: Warm and dry NEUROLOGIC:  Alert and oriented x 3 PSYCHIATRIC:  Normal affect    ASSESSMENT:    1. Hypertension, unspecified type   2. Essential hypertension   3. Hashimoto's thyroiditis    PLAN:    In order of problems listed above:  1. Essential hypertension.  EKG actually showed low voltage.  I asked her to have echocardiogram today to look at potential left ventricle hypertrophy.  On top of that she is complaining of having some exertional shortness of breath and I need to clarify make sure her ejection fraction is normal she does have history of  Hashimoto thyroiditis.  I will make sure she does not have significant pericardial effusion as well especially in view on the fact that she does have low voltage EKG.  I decided to give her also small dose of diuretics only 20 mg of furosemide to see what her blood pressure will respond to it.  We will most likely require long-term medications for her hypertension however final decision in terms of choice of medication will be made based on results of the testing to be doing. 2. Hashimoto thyroiditis.  Thyroid profile checked in the hospital was stable however I think it would be reasonable to consider initiation of Synthroid if hypothyroidism strongly suspected.  What helps with this decision may be also evidence of pericardial effusion which I hope she does not have. 3. Dyspnea on exertion echocardiogram will be done to check left ventricular ejection fraction.   Medication Adjustments/Labs and Tests Ordered: Current medicines are reviewed at length with the patient today.  Concerns regarding medicines are outlined above.  Orders Placed This Encounter  Procedures  . ECHOCARDIOGRAM COMPLETE   No orders of the defined types were placed in this encounter.   Signed, Georgeanna Leaobert J. Isella Slatten, MD, [facility]. 09/08/2018 1:29 PM    [facility]

## 2018-09-27 ENCOUNTER — Other Ambulatory Visit: Payer: Self-pay

## 2018-09-27 MED ORDER — FUROSEMIDE 20 MG PO TABS: 20.0000 mg | ORAL_TABLET | Freq: Every day | ORAL | 1 refills | Status: DC

## 2018-09-29 ENCOUNTER — Other Ambulatory Visit: Payer: Self-pay

## 2018-09-29 ENCOUNTER — Telehealth (INDEPENDENT_AMBULATORY_CARE_PROVIDER_SITE_OTHER): Payer: Commercial Managed Care - PPO | Admitting: Cardiology

## 2018-09-29 ENCOUNTER — Encounter: Payer: Self-pay | Admitting: Cardiology

## 2018-09-29 VITALS — BP 110/70 | Ht 66.0 in | Wt 178.0 lb

## 2018-09-29 DIAGNOSIS — R1310 Dysphagia, unspecified: Secondary | ICD-10-CM

## 2018-09-29 DIAGNOSIS — I10 Essential (primary) hypertension: Secondary | ICD-10-CM

## 2018-09-29 DIAGNOSIS — E063 Autoimmune thyroiditis: Secondary | ICD-10-CM

## 2018-09-29 MED ORDER — FUROSEMIDE 20 MG PO TABS: 20.0000 mg | ORAL_TABLET | Freq: Every day | ORAL | 1 refills | Status: DC

## 2018-09-29 NOTE — Progress Notes (Signed)
Virtual Visit via Telephone Note   This visit type was conducted due to national recommendations for restrictions regarding the COVID-19 Pandemic (e.g. social distancing) in an effort to limit this patient's exposure and mitigate transmission in our community.  Due to her co-morbid illnesses, this patient is at least at moderate risk for complications without adequate follow up.  This format is felt to be most appropriate for this patient at this time.  The patient did not have access to video technology/had technical difficulties with video requiring transitioning to audio format only (telephone).  All issues noted in this document were discussed and addressed.  No physical exam could be performed with this format.  Please refer to the patient's chart for her  consent to telehealth for [facility].  Evaluation Performed:  Follow-up visit  This visit type was conducted due to national recommendations for restrictions regarding the COVID-19 Pandemic (e.g. social distancing).  This format is felt to be most appropriate for this patient at this time.  All issues noted in this document were discussed and addressed.  No physical exam was performed (except for noted visual exam findings with Video Visits).  Please refer to the patient's chart (MyChart message for video visits and phone note for telephone visits) for the patient's consent to telehealth [facility].  Date:  09/29/2018  ID: Ashley Arellano, DOB December 23, 1965, MRN [id number]   [address] [place] [address]  [place] [id number]   [facility]   [facility] [place] [facility]  PCP:  Ashley GosselinLittle, Kevin, MD  Cardiologist:  Ashley Balsamobert Krasowski, MD     Chief Complaint: Doing much better  History of Present Illness:    Ashley SarnaDawn Arellano is a 53 y.o. female  who presents via audio/video conferencing for a telehealth visit today.  With chronic thyroid problem, recently recognized borderline hypertension.  She did have a constellation of  atypical symptoms and presented to my office in about a month ago.  She did have situation with some vision problem and going to the emergency room quite extensive evaluation has been done which was negative.  She was noted to have high blood pressure.  He also noted significant weight gain within few days.  We approach this problem with small dose diuretics she takes 20 mg of furosemide feeling dramatically better there was also some family tragedy.  She is grieving after that.  She actually went up going today beach with her family and she enjoyed it, she is doing much better.  Her abdominal distention is gone her weight is back to normal blood pressure seems to be normal things are looking good.  She did have echocardiogram which showed normal left ventricular ejection fraction overall that was normal echocardiogram   The patient does not have symptoms concerning for COVID-19 infection (fever, chills, cough, or new SHORTNESS OF BREATH).    Prior CV studies:   The following studies were reviewed today:  Echocardiogram done in the hospital conclusion was normal left ventricle size thickness and function ejection fraction 6065% normal diastolic function, normal right ventricle size and function no significant valvular abnormalities.  Estimated pulmonary pressure was 15 mmHg.     Past Medical History:  Diagnosis Date  . Allergy   . Endometriosis   . Galactorrhea 2003   BILATERALLY--NOW RESOLVED  . IBS (irritable bowel syndrome)   . 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 Thyroid disease    Hashimotos    Past Surgical History:  Procedure Laterality  Date  . CERVICAL BIOPSY  W/ LOOP ELECTRODE EXCISION  1998   for mild dysplasia and HPV effect/completely excised  . PELVIC LAPAROSCOPY  10/2000   RIGHT OVARIAN   . TOTAL LAPAROSCOPIC HYSTERECTOMY, SLING  10/2008   leiomyomata, menorrhagia  . WISDOM TOOTH EXTRACTION       Current Meds  Medication Sig  . ALBUTEROL IN Inhale into  the lungs as needed.   . Cetirizine HCl (ZYRTEC PO) Take by mouth.  . diphenhydrAMINE (BENADRYL) 25 MG tablet Take 25 mg by mouth every 6 (six) hours as needed.  Marland Kitchen. EPINEPHrine 0.3 mg/0.3 mL IJ SOAJ injection Inject into the muscle once.  . furosemide (LASIX) 20 MG tablet Take 1 tablet (20 mg total) by mouth daily.  . Naproxen Sodium (ALEVE PO) Take by mouth as needed.   Marland Kitchen. ZOLMitriptan (ZOMIG) 2.5 MG tablet Take 2.5 mg by mouth once. May repeat in 2 hours if headache persists or recurs.      Family History: The patient's family history includes Breast cancer in an other family member; Hypertension in her maternal grandfather; Lung cancer in her father; Multiple sclerosis in her father; Ovarian cancer in her maternal grandmother and mother; Rheum arthritis in her father; Spina bifida in her cousin; Stroke in her maternal grandfather; Uterine cancer in her maternal grandmother and mother.   ROS:   Please see the history of present illness.     All other systems reviewed and are negative.   Labs/Other Tests and Data Reviewed:     Recent Labs: 12/19/2017: TSH 2.64  Recent Lipid Panel    Component Value Date/Time   CHOL 212 (H) 11/25/2016 0910   TRIG 94 11/25/2016 0910   HDL 61 11/25/2016 0910   CHOLHDL 3.5 11/25/2016 0910   VLDL 19 11/25/2016 0910   LDLCALC 132 (H) 11/25/2016 0910      Exam:    Vital Signs:  BP 110/70   Ht 5\' 6"  (1.676 m)   Wt 178 lb (80.7 kg)   LMP 10/27/2008   BMI 28.73 kg/m     Wt Readings from Last 3 Encounters:  09/29/18 178 lb (80.7 kg)  09/08/18 185 lb (83.9 kg)  12/19/17 182 lb (82.6 kg)     Well nourished, well developed in no acute distress. Alert awake in exam 3 not in any distress.  Talking to me over the phone video link did not work.  She is doing well not in any distress  Diagnosis for this visit:   1. Essential hypertension   2. Hashimoto's thyroiditis   3. Dysphagia, unspecified type      ASSESSMENT & PLAN:    1.  Essential  hypertension controlled with small dose diuretics which I will continue.  Chem-7 will be checked. 2.  Hashimoto.  Followed by internal medicine team 3.  Dysphagia denies have any symptoms from that aspect now.  COVID-19 Education: The signs and symptoms of COVID-19 were discussed with the patient and how to seek care for testing (follow up with PCP or arrange E-visit).  The importance of social distancing was discussed today.  Patient Risk:   After full review of this patients clinical status, I feel that they are at least moderate risk at this time.  Time:   Today, I have spent 15 minutes with the patient with telehealth technology discussing pt health issues.  I spent 5 minutes reviewing her chart before the visit.  Visit was finished at 9:53 AM.    Medication Adjustments/Labs and  Tests Ordered: Current medicines are reviewed at length with the patient today.  Concerns regarding medicines are outlined above.  No orders of the defined types were placed in this encounter.  Medication changes: No orders of the defined types were placed in this encounter.    Disposition: Follow-up in 3 months  Signed, Park Liter, MD, [facility] 09/29/2018 9:52 AM    [place]

## 2018-12-18 ENCOUNTER — Other Ambulatory Visit: Payer: Self-pay

## 2018-12-20 ENCOUNTER — Encounter: Payer: Self-pay | Admitting: Internal Medicine

## 2018-12-20 ENCOUNTER — Ambulatory Visit: Payer: Commercial Managed Care - PPO | Admitting: Internal Medicine

## 2018-12-20 ENCOUNTER — Other Ambulatory Visit: Payer: Self-pay

## 2018-12-20 VITALS — BP 110/80 | HR 75 | Ht 66.0 in | Wt 179.2 lb

## 2018-12-20 DIAGNOSIS — E063 Autoimmune thyroiditis: Secondary | ICD-10-CM

## 2018-12-20 DIAGNOSIS — R1310 Dysphagia, unspecified: Secondary | ICD-10-CM

## 2018-12-20 LAB — T4, FREE: Free T4: 1.1 ng/dL (ref 0.60–1.60)

## 2018-12-20 LAB — T3, FREE: T3, Free: 3.1 pg/mL (ref 2.3–4.2)

## 2018-12-20 LAB — TSH: TSH: 2.18 u[IU]/mL (ref 0.35–4.50)

## 2018-12-20 NOTE — Patient Instructions (Addendum)
Please stop at the lab.  Stop Selenium.  If we need to start Levothyroxine, take the thyroid hormone every day, with water, at least 30 minutes before breakfast, separated by at least 4 hours from: - acid reflux medications - calcium - iron - multivitamins  Please come back in 1 year.

## 2018-12-20 NOTE — Progress Notes (Signed)
Patient ID: Ashley Arellano, female   DOB: 09-07-1965, 53 y.o.   MRN: [id number]   HPI  Ashley Arellano is a 53 y.o.-year-old female, initially referred by her PCP, Dr.Little, returning for follow-up for Hashimoto's thyroiditis. Last visit 1 year ago.  Since last visit, he had an episode of high blood pressure in 09/2018.  She presented to [facility] for a blood pressure of 180/120 checked at her work.  TFTs were normal at that time. LFTs were high.  She was started on Lasix >> BP and weight improved. If she forgets to take the Lasix >> BP increases and weight also increases by 3-4 lbs.  Reviewed and addended history: Pt. has been dx with Hashimoto's thyroiditis hypothyroidism in 09/2015, during investigation for a rash - upper body - for 3 months (she has been getting the rash for 4 years). At that point, she had several positive antibody levels, including anticardiolipin IgM, and anti-MCV IgG, ANA IgG.  She is not on levothyroxine as her TFTs have stabilized in the normal range.  Reviewed patient's TFTs: Received labs faxed by patient.  She has been in the emergency room at [facility] with high blood pressure (172/118 in the ED).  The blood pressure came down by itself.  Labs were checked and her TFTs were normal: 09/04/2018: TSH 2.91, free T4 0.94, free T3 3.75.  Glucose was 120.  [facility]: 06/07/2017: TSH 2.56, free T4 1.10, free T3 3.5, all normal  [facility]: 12/21/2016: TSH 2.77, free T4 1.23, both normal. TPO antibodies 63 (0-34)   [facility], drawn on 07/16/2016: TSH 4.20 (0.5-4.67) Free T4 1.25 (0.78-2.19) Free T3 3.63 (2.77-5.27) TFTs now normal. No intervention needed for now. We can recheck her TFTs when she comes back in 5 months.  [facility], drawn on 06/03/2016: TSH 4.97 (0.5-4.67) Free T4 1.06 (0.78-2.19) Free T3 4.02 (2.77-5.27) I  would suggest to repeat the tests in 1.5-2 months. No intervention needed for now. Fluctuating TFTs are common at the beginning of Hashimoto's thyroiditis diagnosis.  Lab Results  Component Value Date   TSH 2.64 12/19/2017   TSH 3.18 12/16/2015   TSH 2.429 01/17/2015   TSH 2.813 01/02/2014   FREET4 0.93 12/19/2017   FREET4 0.77 12/16/2015   Her TPO antibodies were elevated: Component     Latest Ref Rng & Units 12/16/2015 12/19/2017  Thyroglobulin Ab     <2 IU/mL <1   Thyroperoxidase Ab SerPl-aCnc     <9 IU/mL 55 (H) 160 (H)   09/16/2015: ATA <12, anti-TPO antibodies 42 (<35) 04/17/2015: TSH 2.4 12/29/2010: TSH 2.73  Pt denies: - feeling nodules in neck - hoarseness - choking - SOB with lying down But has occasional, dysphagia, which is not new. Mostly with pills, dry foods.  She has + FH of thyroid disorders in: Hashimoto's thyroiditis in both parents. No FH of thyroid cancer. No h/o radiation tx to head or neck.  No seaweed or kelp. No recent contrast studies. No herbal supplements. No Biotin use. No recent steroids use.   Pt. also has a history of migraines, anemia. She had a hysterectomy in 2010. No oophorectomy.  ROS: Constitutional: no weight gain/no weight loss, no fatigue, no subjective hyperthermia, no subjective hypothermia Eyes: no blurry vision, no xerophthalmia ENT: no sore throat, + see HPI Cardiovascular: no CP/no SOB/no palpitations/no leg swelling Respiratory: no cough/no SOB/no wheezing Gastrointestinal: no N/no V/no  D/no C/no acid reflux Musculoskeletal: no muscle aches/no joint aches Skin: no rashes, no hair loss Neurological: + tremors/no numbness/no tingling/no dizziness  I reviewed pt's medications, allergies, PMH, social hx, family hx, and changes were documented in the history of present illness. Otherwise, unchanged from my initial visit note.  Past Medical History:  Diagnosis Date  . Allergy   . Endometriosis   . Galactorrhea 2003    BILATERALLY--NOW RESOLVED  . IBS (irritable bowel syndrome)   . 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 Thyroid disease    Hashimotos   Past Surgical History:  Procedure Laterality Date  . CERVICAL BIOPSY  W/ LOOP ELECTRODE EXCISION  1998   for mild dysplasia and HPV effect/completely excised  . PELVIC LAPAROSCOPY  10/2000   RIGHT OVARIAN   . TOTAL LAPAROSCOPIC HYSTERECTOMY, SLING  10/2008   leiomyomata, menorrhagia  . WISDOM TOOTH EXTRACTION     Social History   Social History  . Marital status: Divorced    Spouse name: N/A  . 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 Smoking status: Never Smoker  . Smokeless tobacco: Never Used  . Alcohol use 0.0 oz/week     Comment: Rare  . Drug use: No   Current Outpatient Medications on File Prior to Visit  Medication Sig Dispense Refill  . ALBUTEROL IN Inhale into the lungs as needed.     . Cetirizine HCl (ZYRTEC PO) Take by mouth.    . diphenhydrAMINE (BENADRYL) 25 MG tablet Take 25 mg by mouth every 6 (six) hours as needed.    Marland Kitchen EPINEPHrine 0.3 mg/0.3 mL IJ SOAJ injection Inject into the muscle once.    . furosemide (LASIX) 20 MG tablet Take 1 tablet (20 mg total) by mouth daily. 90 tablet 1  . Naproxen Sodium (ALEVE PO) Take by mouth as needed.     Marland Kitchen ZOLMitriptan (ZOMIG) 2.5 MG tablet Take 2.5 mg by mouth once. May repeat in 2 hours if headache persists or recurs.     No current facility-administered medications on file prior to visit.    Allergies  Allergen Reactions  . Sunflower Oil Shortness Of Breath and Rash    Any kind of sunflower  . Penicillins     Family reaction   Family History  Problem Relation Age of Onset  . Ovarian cancer Mother   . Uterine cancer Mother   . Ovarian cancer Maternal Grandmother   . Uterine cancer Maternal Grandmother   . Hypertension Maternal Grandfather   . Stroke Maternal Grandfather   . Breast cancer Other        30's  . Spina  bifida Cousin   . Lung cancer Father   . Rheum arthritis Father   . Multiple sclerosis Father    PE: BP 110/80 (BP Location: Left Arm, Patient Position: Sitting, Cuff Size: Normal)   Pulse 75   Ht 5\' 6"  (1.676 m)   Wt 179 lb 3.2 oz (81.3 kg)   LMP 10/27/2008   SpO2 98%   BMI 28.92 kg/m  Wt Readings from Last 3 Encounters:  12/20/18 179 lb 3.2 oz (81.3 kg)  09/29/18 178 lb (80.7 kg)  09/08/18 185 lb (83.9 kg)   Constitutional: overweight, in NAD Eyes: PERRLA, EOMI, no exophthalmos ENT: moist mucous membranes, no thyromegaly, no cervical lymphadenopathy Cardiovascular: RRR, No MRG Respiratory: CTA B Gastrointestinal: abdomen soft, NT, ND, BS+ Musculoskeletal: no deformities, strength  intact in all 4 Skin: moist, warm, no rashes Neurological: + Mild tremor tremor with outstretched left hand, DTR normal in all 4  ASSESSMENT: 1. Hashimoto thyroiditis  2. Dysphagia  PLAN: 1. Hashimoto thyroiditis -We reviewed together latest TFTs from 09/2018 and these are normal.   -Of note, at last visit, her TPO antibodies were elevated and I suggested to start selenium 200 mcg daily. -We again discussed that Hashimoto's thyroiditis is a progressive autoimmune disease, in which the thyroid gland slowly becomes underactive.  As of now, she does not require thyroid hormones. -As of now, she does not require levothyroxine, however, if the TSH starts increasing, we can start low-dose levothyroxine, 25 mcg daily.  We discussed about correct intake of levothyroxine, in case we need to start: -in a.m. -Fasting -At least 30 minutes from breakfast -At least 4 hours from calcium, iron, multivitamins, PPIs - We will check her TFTs today along with her TPO antibodies.  However, we will stop selenium due to her unusual history of HTN and elevated LFTs - I will see her back in 1 year  2. Dysphagia -Intermittent, stable -We again discussed that thyroid enlargement especially in the setting of  Hashimoto's thyroiditis is common and has a waxing/waning character  Component     Latest Ref Rng & Units 12/20/2018  TSH     0.35 - 4.50 uIU/mL 2.18  T4,Free(Direct)     0.60 - 1.60 ng/dL 1.10  Triiodothyronine,Free,Serum     2.3 - 4.2 pg/mL 3.1  Thyroperoxidase Ab SerPl-aCnc     <9 IU/mL 27 (H)   Thyroid tests are normal.  No need to start levothyroxine for now.  Her TPO antibodies improved significantly, but I would suggest to stop selenium due to her other medical history.  Philemon Kingdom, MD PhD [facility]

## 2018-12-21 ENCOUNTER — Encounter: Payer: Self-pay | Admitting: Internal Medicine

## 2018-12-21 LAB — THYROID PEROXIDASE ANTIBODY: Thyroperoxidase Ab SerPl-aCnc: 27 IU/mL — ABNORMAL HIGH (ref <9)

## 2018-12-27 ENCOUNTER — Encounter: Payer: Self-pay | Admitting: Gynecology

## 2019-01-01 ENCOUNTER — Other Ambulatory Visit: Payer: Self-pay

## 2019-01-01 ENCOUNTER — Encounter: Payer: Self-pay | Admitting: Cardiology

## 2019-01-01 ENCOUNTER — Ambulatory Visit (INDEPENDENT_AMBULATORY_CARE_PROVIDER_SITE_OTHER): Payer: Commercial Managed Care - PPO | Admitting: Cardiology

## 2019-01-01 VITALS — BP 108/66 | HR 68 | Ht 66.0 in | Wt 181.3 lb

## 2019-01-01 DIAGNOSIS — E063 Autoimmune thyroiditis: Secondary | ICD-10-CM

## 2019-01-01 DIAGNOSIS — R1310 Dysphagia, unspecified: Secondary | ICD-10-CM | POA: Diagnosis not present

## 2019-01-01 DIAGNOSIS — I10 Essential (primary) hypertension: Secondary | ICD-10-CM

## 2019-01-01 NOTE — Progress Notes (Signed)
Cardiology Office Note:    Date:  01/01/2019   ID:  Ashley Arellano, DOB Dec 16, 1965, MRN [id number]  PCP:  Hulan Fess, MD  Cardiologist:  Jenne Campus, MD    Referring MD: Hulan Fess, MD   Chief Complaint  Patient presents with  . Follow-up  Doing well  History of Present Illness:    Ashley Arellano is a 53 y.o. female who initially presented to our office because of high blood pressure.  Blood pressure appears to be well controlled right now.  She also has a constellation of very atypical symptoms.  She does have Hashimoto goiter and she will be seen by endocrinology for that.  Overall she seems to be doing well blood pressure seems to be well controlled recently she sustained injury to her right knee but is gradually getting better.  She eventually required surgery.  Today she is telling me that she does have antiphospholipid antibody syndrome that was diagnosed in many years ago.  I asked her to see hematologist for this.  Past Medical History:  Diagnosis Date  . Allergy   . Endometriosis   . Galactorrhea 2003   BILATERALLY--NOW RESOLVED  . IBS (irritable bowel syndrome)   . 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 Thyroid disease    Hashimotos    Past Surgical History:  Procedure Laterality Date  . CERVICAL BIOPSY  W/ LOOP ELECTRODE EXCISION  1998   for mild dysplasia and HPV effect/completely excised  . PELVIC LAPAROSCOPY  10/2000   RIGHT OVARIAN   . TOTAL LAPAROSCOPIC HYSTERECTOMY, SLING  10/2008   leiomyomata, menorrhagia  . WISDOM TOOTH EXTRACTION      Current Medications: Current Meds  Medication Sig  . ALBUTEROL IN Inhale into the lungs as needed.   . Cetirizine HCl (ZYRTEC PO) Take by mouth.  . diphenhydrAMINE (BENADRYL) 25 MG tablet Take 25 mg by mouth every 6 (six) hours as needed.  Marland Kitchen EPINEPHrine 0.3 mg/0.3 mL IJ SOAJ injection Inject into the muscle once.  . furosemide (LASIX) 20 MG tablet Take 1 tablet (20 mg total) by mouth daily.  . Naproxen  Sodium (ALEVE PO) Take by mouth as needed.   Marland Kitchen ZOLMitriptan (ZOMIG) 2.5 MG tablet Take 2.5 mg by mouth once. May repeat in 2 hours if headache persists or recurs.     Allergies:   Sunflower oil and Penicillins   Social History   Socioeconomic History  . Marital status: Divorced    Spouse name: Not on file  . Number of children: Not on file  . Years of education: Not on file  . Highest education level: Not on file  Occupational History  . Not on file  Social Needs  . Financial resource strain: Not on file  . Food insecurity    Worry: Not on file    Inability: Not on file  . Transportation needs    Medical: Not on file    Non-medical: Not on file  Tobacco Use  . Smoking status: Never Smoker  . Smokeless tobacco: Never Used  Substance and Sexual Activity  . Alcohol use: Yes    Alcohol/week: 0.0 standard drinks    Comment: Rare  . Drug use: No  . Sexual activity: Never    Birth control/protection: Surgical    Comment: 1st intercourse 53 yo-Fewer than 5 partners-HYST  Lifestyle  . Physical activity    Days per week: Not on file    Minutes per session: Not on file  . Stress:  Not on file  Relationships  . Social Musician on phone: Not on file    Gets together: Not on file    Attends religious service: Not on file    Active member of club or organization: Not on file    Attends meetings of clubs or organizations: Not on file    Relationship status: Not on file  Other Topics Concern  . Not on file  Social History Narrative  . Not on file     Family History: The patient's family history includes Breast cancer in an other family member; Hypertension in her maternal grandfather; Lung cancer in her father; Multiple sclerosis in her father; Ovarian cancer in her maternal grandmother and mother; Rheum arthritis in her father; Spina bifida in her cousin; Stroke in her maternal grandfather; Uterine cancer in her maternal grandmother and mother. ROS:   Please see  the history of present illness.    All 14 point review of systems negative except as described per history of present illness  EKGs/Labs/Other Studies Reviewed:      Recent Labs: 12/20/2018: TSH 2.18  Recent Lipid Panel    Component Value Date/Time   CHOL 212 (H) 11/25/2016 0910   TRIG 94 11/25/2016 0910   HDL 61 11/25/2016 0910   CHOLHDL 3.5 11/25/2016 0910   VLDL 19 11/25/2016 0910   LDLCALC 132 (H) 11/25/2016 0910    Physical Exam:    VS:  BP 108/66   Pulse 68   Ht 5\' 6"  (1.676 m)   Wt 181 lb 4.8 oz (82.2 kg)   LMP 10/27/2008   SpO2 99%   BMI 29.26 kg/m     Wt Readings from Last 3 Encounters:  01/01/19 181 lb 4.8 oz (82.2 kg)  12/20/18 179 lb 3.2 oz (81.3 kg)  09/29/18 178 lb (80.7 kg)     GEN:  Well nourished, well developed in no acute distress HEENT: Normal NECK: No JVD; No carotid bruits LYMPHATICS: No lymphadenopathy CARDIAC: RRR, no murmurs, no rubs, no gallops RESPIRATORY:  Clear to auscultation without rales, wheezing or rhonchi  ABDOMEN: Soft, non-tender, non-distended MUSCULOSKELETAL:  No edema; No deformity  SKIN: Warm and dry LOWER EXTREMITIES: no swelling NEUROLOGIC:  Alert and oriented x 3 PSYCHIATRIC:  Normal affect   ASSESSMENT:    1. Essential hypertension   2. Dysphagia, unspecified type   3. Hashimoto's thyroiditis    PLAN:    In order of problems listed above:  1. Essential hypertension blood pressure appears to be well controlled continue present management. 2. Dysphasia gone doing well from that point review 3. Hashimoto thyroiditis followed by internal medicine team and endocrinology  Overall cardiac wise she is doing well I will refer her to hematology.  See her back in 6 months   Medication Adjustments/Labs and Tests Ordered: Current medicines are reviewed at length with the patient today.  Concerns regarding medicines are outlined above.  No orders of the defined types were placed in this encounter.  Medication  changes: No orders of the defined types were placed in this encounter.   Signed, 10/01/18, MD, [facility] 01/01/2019 1:30 PM    [facility]

## 2019-01-01 NOTE — Patient Instructions (Signed)
Medication Instructions:  Your physician recommends that you continue on your current medications as directed. Please refer to the Current Medication list given to you today.  If you need a refill on your cardiac medications before your next appointment, please call your pharmacy.   Lab work: None ordered If you have labs (blood work) drawn today and your tests are completely normal, you will receive your results only by: Marland Kitchen MyChart Message (if you have MyChart) OR . A paper copy in the mail If you have any lab test that is abnormal or we need to change your treatment, we will call you to review the results.  Testing/Procedures: None ordered  Follow-Up: At [facility], you and your health needs are our priority.  As part of our continuing mission to provide you with exceptional heart care, we have created designated Provider Care Teams.  These Care Teams include your primary Cardiologist (physician) and Advanced Practice Providers (APPs -  Physician Assistants and Nurse Practitioners) who all work together to provide you with the care you need, when you need it. You will need a follow up appointment in 6 months.  Please call our office 2 months in advance to schedule this appointment.  You may see Jenne Campus or another member of our [employer] Provider Team in [place]: Shirlee More, MD . Jyl Heinz, MD

## 2019-01-28 ENCOUNTER — Other Ambulatory Visit: Payer: Self-pay | Admitting: Cardiology

## 2019-03-19 ENCOUNTER — Encounter: Payer: Self-pay | Admitting: Internal Medicine

## 2019-07-10 ENCOUNTER — Ambulatory Visit (INDEPENDENT_AMBULATORY_CARE_PROVIDER_SITE_OTHER): Payer: Commercial Managed Care - PPO | Admitting: Cardiology

## 2019-07-10 ENCOUNTER — Encounter: Payer: Self-pay | Admitting: Cardiology

## 2019-07-10 ENCOUNTER — Other Ambulatory Visit: Payer: Self-pay

## 2019-07-10 VITALS — BP 112/82 | HR 90 | Ht 66.0 in | Wt 186.8 lb

## 2019-07-10 DIAGNOSIS — R079 Chest pain, unspecified: Secondary | ICD-10-CM

## 2019-07-10 DIAGNOSIS — I10 Essential (primary) hypertension: Secondary | ICD-10-CM

## 2019-07-10 DIAGNOSIS — E063 Autoimmune thyroiditis: Secondary | ICD-10-CM

## 2019-07-10 DIAGNOSIS — R1310 Dysphagia, unspecified: Secondary | ICD-10-CM

## 2019-07-10 NOTE — Patient Instructions (Signed)
Medication Instructions:  Your physician recommends that you continue on your current medications as directed. Please refer to the Current Medication list given to you today. *If you need a refill on your cardiac medications before your next appointment, please call your pharmacy*  Lab Work: None ordered. If you have labs (blood work) drawn today and your tests are completely normal, you will receive your results only by: Marland Kitchen MyChart Message (if you have MyChart) OR . A paper copy in the mail If you have any lab test that is abnormal or we need to change your treatment, we will call you to review the results.  Testing/Procedures:  You will be scheduled for a calcium score. This test is done at the [facility] in [place] [address]  Follow-Up: At [facility], you and your health needs are our priority.  As part of our continuing mission to provide you with exceptional heart care, we have created designated Provider Care Teams.  These Care Teams include your primary Cardiologist (physician) and Advanced Practice Providers (APPs -  Physician Assistants and Nurse Practitioners) who all work together to provide you with the care you need, when you need it.  We recommend signing up for the patient portal called "MyChart".  Sign up information is provided on this After Visit Summary.  MyChart is used to connect with patients for Virtual Visits (Telemedicine).  Patients are able to view lab/test results, encounter notes, upcoming appointments, etc.  Non-urgent messages can be sent to your provider as well.   To learn more about what you can do with MyChart, go to [email or web address].    Your next appointment:   6 month(s)  The format for your next appointment:   In Person  Provider:   Gypsy Balsam, MD

## 2019-07-10 NOTE — Progress Notes (Signed)
Cardiology Office Note:    Date:  07/10/2019   ID:  Ashley Arellano, DOB 02-27-1966, MRN [id number]  PCP:  Catha Gosselin, MD  Cardiologist:  Gypsy Balsam, MD    Referring MD: Catha Gosselin, MD   No chief complaint on file. I am doing fine  History of Present Illness:    Ashley Arellano is a 54 y.o. female with past medical history significant for essential hypertension, endometriosis, Hashimoto disease.  Comes to the hospital after which she is looking better.  Blood pressure seems to be controlled she described above.  Body.  Apparently years ago she did have rheumatological work-up done which was negative however suggested that she rheumatologist.  She has been also complaining of atypical chest pain not related to exercise and she is worried she may be having coronary artery disease.  Therefore, I will schedule her to have a coronary calcium score.  Past Medical History:  Diagnosis Date  . Allergy   . Endometriosis   . Galactorrhea 2003   BILATERALLY--NOW RESOLVED  . IBS (irritable bowel syndrome)   . 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 Thyroid disease    Hashimotos    Past Surgical History:  Procedure Laterality Date  . CERVICAL BIOPSY  W/ LOOP ELECTRODE EXCISION  1998   for mild dysplasia and HPV effect/completely excised  . PELVIC LAPAROSCOPY  10/2000   RIGHT OVARIAN   . TOTAL LAPAROSCOPIC HYSTERECTOMY, SLING  10/2008   leiomyomata, menorrhagia  . WISDOM TOOTH EXTRACTION      Current Medications: Current Meds  Medication Sig  . ALBUTEROL IN Inhale into the lungs as needed.   . Cetirizine HCl (ZYRTEC PO) Take by mouth.  . diphenhydrAMINE (BENADRYL) 25 MG tablet Take 25 mg by mouth every 6 (six) hours as needed.  Marland Kitchen EPINEPHrine 0.3 mg/0.3 mL IJ SOAJ injection Inject into the muscle once.  . furosemide (LASIX) 20 MG tablet TAKE 1 TABLET BY MOUTH  DAILY  . Naproxen Sodium (ALEVE PO) Take by mouth as needed.   Marland Kitchen ZOLMitriptan (ZOMIG) 2.5 MG tablet Take 2.5 mg by mouth  once. May repeat in 2 hours if headache persists or recurs.     Allergies:   Sunflower oil and Penicillins   Social History   Socioeconomic History  . Marital status: Divorced    Spouse name: Not on file  . Number of children: Not on file  . Years of education: Not on file  . Highest education level: Not on file  Occupational History  . Not on file  Tobacco Use  . Smoking status: Never Smoker  . Smokeless tobacco: Never Used  Substance and Sexual Activity  . Alcohol use: Yes    Alcohol/week: 0.0 standard drinks    Comment: Rare  . Drug use: No  . Sexual activity: Never    Birth control/protection: Surgical    Comment: 1st intercourse 54 yo-Fewer than 5 partners-HYST  Other Topics Concern  . Not on file  Social History Narrative  . Not on file   Social Determinants of Health   Financial Resource Strain:   . Difficulty of Paying Living Expenses:   Food Insecurity:   . Worried About Programme researcher, broadcasting/film/video in the Last Year:   . Barista in the Last Year:   Transportation Needs:   . Freight forwarder (Medical):   Marland Kitchen Lack of Transportation (Non-Medical):   Physical Activity:   . Days of Exercise per Week:   .  Minutes of Exercise per Session:   Stress:   . Feeling of Stress :   Social Connections:   . Frequency of Communication with Friends and Family:   . Frequency of Social Gatherings with Friends and Family:   . Attends Religious Services:   . Active Member of Clubs or Organizations:   . Attends Archivist Meetings:   Marland Kitchen Marital Status:      Family History: The patient's family history includes Breast cancer in an other family member; Hypertension in her maternal grandfather; Lung cancer in her father; Multiple sclerosis in her father; Ovarian cancer in her maternal grandmother and mother; Rheum arthritis in her father; Spina bifida in her cousin; Stroke in her maternal grandfather; Uterine cancer in her maternal grandmother and mother. ROS:     Please see the history of present illness.    All 14 point review of systems negative except as described per history of present illness  EKGs/Labs/Other Studies Reviewed:      Recent Labs: 12/20/2018: TSH 2.18  Recent Lipid Panel    Component Value Date/Time   CHOL 212 (H) 11/25/2016 0910   TRIG 94 11/25/2016 0910   HDL 61 11/25/2016 0910   CHOLHDL 3.5 11/25/2016 0910   VLDL 19 11/25/2016 0910   LDLCALC 132 (H) 11/25/2016 0910    Physical Exam:    VS:  BP 112/82   Pulse 90   Ht 5\' 6"  (1.676 m)   Wt 186 lb 12.8 oz (84.7 kg)   LMP 10/27/2008   SpO2 97%   BMI 30.15 kg/m     Wt Readings from Last 3 Encounters:  07/10/19 186 lb 12.8 oz (84.7 kg)  01/01/19 181 lb 4.8 oz (82.2 kg)  12/20/18 179 lb 3.2 oz (81.3 kg)     GEN:  Well nourished, well developed in no acute distress HEENT: Normal NECK: No JVD; No carotid bruits LYMPHATICS: No lymphadenopathy CARDIAC: RRR, no murmurs, no rubs, no gallops RESPIRATORY:  Clear to auscultation without rales, wheezing or rhonchi  ABDOMEN: Soft, non-tender, non-distended MUSCULOSKELETAL:  No edema; No deformity  SKIN: Warm and dry LOWER EXTREMITIES: no swelling NEUROLOGIC:  Alert and oriented x 3 PSYCHIATRIC:  Normal affect   ASSESSMENT:    1. Essential hypertension   2. Dysphagia, unspecified type   3. Hashimoto's thyroiditis    PLAN:    In order of problems listed above:  1. Essential hypertension blood pressure appears to be controlled 2. Dysphagia: Denies having any 3. Hashimoto doing well from that point review 4. Multiple pains including chest pain.  Calcium score will be done to rule out coronary artery disease. 5. Also recommended her to see rheumatologist   Medication Adjustments/Labs and Tests Ordered: Current medicines are reviewed at length with the patient today.  Concerns regarding medicines are outlined above.  No orders of the defined types were placed in this encounter.  Medication changes: No  orders of the defined types were placed in this encounter.   Signed, Park Liter, MD, [facility] 07/10/2019 4:35 PM    [place]

## 2019-09-28 ENCOUNTER — Other Ambulatory Visit: Payer: Self-pay | Admitting: Cardiology

## 2019-12-21 ENCOUNTER — Other Ambulatory Visit: Payer: Self-pay

## 2019-12-21 ENCOUNTER — Encounter: Payer: Self-pay | Admitting: Internal Medicine

## 2019-12-21 ENCOUNTER — Ambulatory Visit: Payer: Commercial Managed Care - PPO | Admitting: Internal Medicine

## 2019-12-21 VITALS — BP 120/86 | HR 80 | Ht 66.0 in | Wt 181.0 lb

## 2019-12-21 DIAGNOSIS — E063 Autoimmune thyroiditis: Secondary | ICD-10-CM | POA: Diagnosis not present

## 2019-12-21 DIAGNOSIS — R7989 Other specified abnormal findings of blood chemistry: Secondary | ICD-10-CM | POA: Diagnosis not present

## 2019-12-21 NOTE — Progress Notes (Addendum)
Patient ID: Ashley Arellano, female   DOB: 1965-07-31, 54 y.o.   MRN: [id number]  This visit occurred during the SARS-CoV-2 public health emergency.  Safety protocols were in place, including screening questions prior to the visit, additional usage of staff PPE, and extensive cleaning of exam room while observing appropriate contact time as indicated for disinfecting solutions.   HPI  Ashley Arellano is a 54 y.o.-year-old female, initially referred by her PCP, Dr.Little, returning for follow-up for Hashimoto's thyroiditis. Last visit 1 year ago.  She is very distressed at this visit as her father just died due to acute pancreatitis, and she believes that this may have been a side effect of the coronavirus vaccine that he received 8 weeks prior.  Before last visit, she had an episode of high blood pressure in 09/2018.  At that time, she went to [facility] and the blood pressure was 180/120.  TFTs were abnormal then but LFTs were high.  She started Lasix.  Since then, blood pressure improved.  Reviewed history: Pt. has been dx with Hashimoto's thyroiditis hypothyroidism in 09/2015, during investigation for a rash - upper body - for 3 months (she has been getting the rash for 4 years). At that point, she had several positive antibody levels, including anticardiolipin IgM, and anti-MCV IgG, ANA IgG.  She did not require levothyroxine.  We started selenium in 12/2017 and her thyroid antibodies improved, however, due to her unusual blood pressure episode and her elevated LFTs, we stopped selenium in 12/2018.  Reviewed her TFTs: Lab Results  Component Value Date   TSH 2.18 12/20/2018   TSH 2.64 12/19/2017   TSH 3.18 12/16/2015   TSH 2.429 01/17/2015   TSH 2.813 01/02/2014   FREET4 1.10 12/20/2018   FREET4 0.93 12/19/2017   FREET4 0.77 12/16/2015   Received labs faxed by patient.  She has been in the emergency room at [facility] with high blood pressure (172/118 in the ED).  The blood pressure  came down by itself.  Labs were checked and her TFTs were normal: 09/04/2018: TSH 2.91, free T4 0.94, free T3 3.75.  Glucose was 120.  Received labs from [facility]: 06/07/2017: TSH 2.56, free T4 1.10, free T3 3.5, all normal  Received labs from [facility]: 12/21/2016: TSH 2.77, free T4 1.23, both normal. TPO antibodies 63 (0-34)   Received labs from [facility], drawn on 07/16/2016: TSH 4.20 (0.5-4.67) Free T4 1.25 (0.78-2.19) Free T3 3.63 (2.77-5.27) TFTs now normal. No intervention needed for now. We can recheck her TFTs when she comes back in 5 months.  Received labs from [facility] laboratory, drawn on 06/03/2016: TSH 4.97 (0.5-4.67) Free T4 1.06 (0.78-2.19) Free T3 4.02 (2.77-5.27)  Also: Component     Latest Ref Rng & Units 12/20/2018  Thyroperoxidase Ab SerPl-aCnc     <9 IU/mL 27 (H)   Component     Latest Ref Rng & Units 12/16/2015 12/19/2017  Thyroglobulin Ab     <2 IU/mL <1   Thyroperoxidase Ab SerPl-aCnc     <9 IU/mL 55 (H) 160 (H)   09/16/2015: ATA <12, anti-TPO antibodies 42 (<35) 04/17/2015: TSH 2.4 12/29/2010: TSH 2.73  Pt denies: - feeling nodules in neck - hoarseness - choking - SOB with lying down But she continues to have occasional dysphagia, which is chronic.  This is mostly with pills and dry foods, but occasionally with water.  She has + FH of thyroid disorders in: Hashimoto's thyroiditis in both parents. No FH  of thyroid cancer. No h/o radiation tx to head or neck.  No recent contrast studies. No herbal supplements. No Biotin use. No recent steroids use.   Pt. also has a history of migraines, anemia. She had a hysterectomy in 2010. No oophorectomy.  On milk thistle for liver health.  She had transaminitis in 2020.  No records available.  Reviewed her transaminases from 2018: Lab Results  Component Value Date   ALT 16 11/23/2016   AST 19 11/23/2016   ALKPHOS 62 11/23/2016   BILITOT 0.3 11/23/2016     ROS: Constitutional: no weight gain/no weight loss, no fatigue, no subjective hyperthermia, no subjective hypothermia Eyes: no blurry vision, no xerophthalmia ENT: no sore throat, + see HPI Cardiovascular: no CP/no SOB/no palpitations/no leg swelling Respiratory: no cough/no SOB/no wheezing Gastrointestinal: no N/no V/no D/no C/no acid reflux Musculoskeletal: no muscle aches/no joint aches Skin: + rash on face, no hair loss Neurological: + tremors/no numbness/no tingling/no dizziness  I reviewed pt's medications, allergies, PMH, social hx, family hx, and changes were documented in the history of present illness. Otherwise, unchanged from my initial visit note.  Past Medical History:  Diagnosis Date  . Allergy   . Endometriosis   . Galactorrhea 2003   BILATERALLY--NOW RESOLVED  . IBS (irritable bowel syndrome)   . 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 Thyroid disease    Hashimotos   Past Surgical History:  Procedure Laterality Date  . CERVICAL BIOPSY  W/ LOOP ELECTRODE EXCISION  1998   for mild dysplasia and HPV effect/completely excised  . PELVIC LAPAROSCOPY  10/2000   RIGHT OVARIAN   . TOTAL LAPAROSCOPIC HYSTERECTOMY, SLING  10/2008   leiomyomata, menorrhagia  . WISDOM TOOTH EXTRACTION     Social History   Social History  . Marital status: Divorced    Spouse name: N/A  . 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 Smoking status: Never Smoker  . Smokeless tobacco: Never Used  . Alcohol use 0.0 oz/week     Comment: Rare  . Drug use: No   Current Outpatient Medications on File Prior to Visit  Medication Sig Dispense Refill  . ALBUTEROL IN Inhale into the lungs as needed.     . Cetirizine HCl (ZYRTEC PO) Take by mouth.    . diphenhydrAMINE (BENADRYL) 25 MG tablet Take 25 mg by mouth every 6 (six) hours as needed.    Marland Kitchen EPINEPHrine 0.3 mg/0.3 mL IJ SOAJ injection Inject into the muscle once.    . furosemide  (LASIX) 20 MG tablet TAKE 1 TABLET BY MOUTH  DAILY 90 tablet 2  . Naproxen Sodium (ALEVE PO) Take by mouth as needed.     Marland Kitchen ZOLMitriptan (ZOMIG) 2.5 MG tablet Take 2.5 mg by mouth once. May repeat in 2 hours if headache persists or recurs.     No current facility-administered medications on file prior to visit.   Allergies  Allergen Reactions  . Sunflower Oil Shortness Of Breath and Rash    Any kind of sunflower  . Penicillins     Family reaction   Family History  Problem Relation Age of Onset  . Ovarian cancer Mother   . Uterine cancer Mother   . Ovarian cancer Maternal Grandmother   . Uterine cancer Maternal Grandmother   . Hypertension Maternal Grandfather   . Stroke Maternal Grandfather   . Breast cancer Other  30's  . Spina bifida Cousin   . Lung cancer Father   . Rheum arthritis Father   . Multiple sclerosis Father    PE: BP 120/86   Pulse 80   Ht 5\' 6"  (1.676 m)   Wt 181 lb (82.1 kg)   LMP 10/27/2008   SpO2 98%   BMI 29.21 kg/m  Wt Readings from Last 3 Encounters:  12/21/19 181 lb (82.1 kg)  07/10/19 186 lb 12.8 oz (84.7 kg)  01/01/19 181 lb 4.8 oz (82.2 kg)   Constitutional: overweight, in NAD Eyes: PERRLA, EOMI, no exophthalmos ENT: moist mucous membranes, no thyromegaly, no cervical lymphadenopathy Cardiovascular: RRR, No MRG Respiratory: CTA B Gastrointestinal: abdomen soft, NT, ND, BS+ Musculoskeletal: no deformities, strength intact in all 4 Skin: moist, warm, + rash on face-chronic Neurological: + tremor with outstretched hands, DTR normal in all 4  ASSESSMENT: 1. Hashimoto thyroiditis  2. Transaminitis  PLAN: 1. Euthyroid Hashimoto's thyroiditis -We reviewed together the labs from 09/2018 and from last visit and they were normal -However, TPO antibodies were elevated in the past and we started selenium.  They were still elevated at last visit and I did advise her to start selenium due to her unusual history of HTN and elevated  LFTs. -For now, we continue to follow her without treatment but we discussed that if her TFTs start to worsen and she becomes hypothyroid, will need to start levothyroxine -We discussed about how to take levothyroxine correctly if she needs to start.  We will most likely start at a low dose, 25 mcg daily.  I advised her to take this: -in a.m. -Fasting -At least 30 minutes from breakfast -At least 4 hours from calcium, iron, multivitamins, PPIs -For now, we will check her TFTs again (she will have [facility] checked [facility] but will not check her antibody titers since we are not starting back selenium -I will see her back in 1 year  2. Transaminitis -She had a history of transaminitis when she presented for hypertension in 09/2018.  The LFTs were checked at [facility] and I do not have the records.  She tells me that she did not have repeat LFTs afterwards. -We will repeat this today and I advised her that if these are elevated, she needs to schedule an appointment with PCP right away. -She denies right upper quadrant pain or jaundice.  Orders Placed This Encounter  Procedures  . Hepatic Function Panel  . TSH  . T4, free  . T3, free   Addendum:   TFTs normal, as are her LFTs.  [facility], MD PhD [facility] Endocrinology

## 2019-12-21 NOTE — Patient Instructions (Signed)
Please stop at the lab.  If we need to start Levothyroxine, take the thyroid hormone every day, with water, at least 30 minutes before breakfast, separated by at least 4 hours from: - acid reflux medications - calcium - iron - multivitamins  Please come back in 1 year.

## 2019-12-24 ENCOUNTER — Encounter: Payer: Self-pay | Admitting: Internal Medicine

## 2019-12-25 ENCOUNTER — Encounter: Payer: Self-pay | Admitting: Internal Medicine

## 2020-01-21 ENCOUNTER — Ambulatory Visit: Payer: Commercial Managed Care - PPO | Admitting: Cardiology

## 2020-01-23 DIAGNOSIS — N809 Endometriosis, unspecified: Secondary | ICD-10-CM | POA: Insufficient documentation

## 2020-01-23 DIAGNOSIS — K589 Irritable bowel syndrome without diarrhea: Secondary | ICD-10-CM | POA: Insufficient documentation

## 2020-01-23 DIAGNOSIS — E079 Disorder of thyroid, unspecified: Secondary | ICD-10-CM | POA: Insufficient documentation

## 2020-01-23 DIAGNOSIS — T7840XA Allergy, unspecified, initial encounter: Secondary | ICD-10-CM | POA: Insufficient documentation

## 2020-01-24 ENCOUNTER — Ambulatory Visit: Payer: Commercial Managed Care - PPO | Admitting: Cardiology

## 2020-01-24 ENCOUNTER — Other Ambulatory Visit: Payer: Self-pay

## 2020-01-24 ENCOUNTER — Encounter: Payer: Self-pay | Admitting: Cardiology

## 2020-01-24 VITALS — BP 120/80 | HR 71 | Ht 66.0 in | Wt 185.0 lb

## 2020-01-24 DIAGNOSIS — I10 Essential (primary) hypertension: Secondary | ICD-10-CM

## 2020-01-24 DIAGNOSIS — R0789 Other chest pain: Secondary | ICD-10-CM

## 2020-01-24 DIAGNOSIS — E063 Autoimmune thyroiditis: Secondary | ICD-10-CM | POA: Diagnosis not present

## 2020-01-24 HISTORY — DX: Other chest pain: R07.89

## 2020-01-24 NOTE — Patient Instructions (Signed)
Medication Instructions:  Your physician recommends that you continue on your current medications as directed. Please refer to the Current Medication list given to you today.  *If you need a refill on your cardiac medications before your next appointment, please call your pharmacy*   Lab Work: Your physician recommends that you return for lab work today: lipid  If you have labs (blood work) drawn today and your tests are completely normal, you will receive your results only by: Marland Kitchen MyChart Message (if you have MyChart) OR . A paper copy in the mail If you have any lab test that is abnormal or we need to change your treatment, we will call you to review the results.   Testing/Procedures: Non-Cardiac CT scanning, (CAT scanning), is a noninvasive, special x-ray that produces cross-sectional images of the body using x-rays and a computer. CT scans help physicians diagnose and treat medical conditions. For some CT exams, a contrast material is used to enhance visibility in the area of the body being studied. CT scans provide greater clarity and reveal more details than regular x-ray exams.     Follow-Up: At [facility], you and your health needs are our priority.  As part of our continuing mission to provide you with exceptional heart care, we have created designated Provider Care Teams.  These Care Teams include your primary Cardiologist (physician) and Advanced Practice Providers (APPs -  Physician Assistants and Nurse Practitioners) who all work together to provide you with the care you need, when you need it.  We recommend signing up for the patient portal called "MyChart".  Sign up information is provided on this After Visit Summary.  MyChart is used to connect with patients for Virtual Visits (Telemedicine).  Patients are able to view lab/test results, encounter notes, upcoming appointments, etc.  Non-urgent messages can be sent to your provider as well.   To learn more about what you can  do with MyChart, go to [email or web address].    Your next appointment:   6 month(s)  The format for your next appointment:   In Person  Provider:   Gypsy Balsam, MD   Other Instructions

## 2020-01-24 NOTE — Progress Notes (Signed)
ca

## 2020-01-24 NOTE — Progress Notes (Signed)
Cardiology Office Note:    Date:  01/24/2020   ID:  Ashley Arellano, DOB [birth year], MRN [id number]  PCP:  Catha Gosselin, MD  Cardiologist:  Gypsy Balsam, MD    Referring MD: Catha Gosselin, MD   Chief Complaint  Patient presents with  . Follow-up  I am doing better  History of Present Illness:    Ashley Arellano is a 54 y.o. female with past medical history significant for essential hypertension, atypical chest pain, history of endometriosis, Hashimoto.  She comes today 2 months for follow-up.  She is doing better she said still described to have some atypical chest pain sometimes but overall it was happening very rarely.  She lost her father just few months ago and of course is very upset about that.  She works as a Scientist, research (medical) in the hospital and I see her there quite often.  She seems to be doing well.  Past Medical History:  Diagnosis Date  . Allergy   . Dysphagia 12/16/2016   Intermittent  . Endometriosis   . Essential hypertension 09/08/2018  . Galactorrhea 2003   BILATERALLY--NOW RESOLVED  . Hashimoto's thyroiditis 12/16/2015  . IBS (irritable bowel syndrome)   . Postoperative examination 09/11/2015  . Sebaceous cyst 09/03/2015  . 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 Thyroid disease    Hashimotos    Past Surgical History:  Procedure Laterality Date  . CERVICAL BIOPSY  W/ LOOP ELECTRODE EXCISION  1998   for mild dysplasia and HPV effect/completely excised  . PELVIC LAPAROSCOPY  10/2000   RIGHT OVARIAN   . TOTAL LAPAROSCOPIC HYSTERECTOMY, SLING  10/2008   leiomyomata, menorrhagia  . WISDOM TOOTH EXTRACTION      Current Medications: Current Meds  Medication Sig  . ALBUTEROL IN Inhale into the lungs as needed.   . Cetirizine HCl (ZYRTEC PO) Take by mouth.  . diphenhydrAMINE (BENADRYL) 25 MG tablet Take 25 mg by mouth every 6 (six) hours as needed.  Marland Kitchen EPINEPHrine 0.3 mg/0.3 mL IJ SOAJ injection Inject into the muscle once.  . furosemide (LASIX) 20 MG tablet  TAKE 1 TABLET BY MOUTH  DAILY  . Naproxen Sodium (ALEVE PO) Take by mouth as needed.   Marland Kitchen ZOLMitriptan (ZOMIG) 2.5 MG tablet Take 2.5 mg by mouth once. May repeat in 2 hours if headache persists or recurs.     Allergies:   Sunflower oil and Penicillins   Social History   Socioeconomic History  . Marital status: Divorced    Spouse name: Not on file  . Number of children: Not on file  . Years of education: Not on file  . Highest education level: Not on file  Occupational History  . Not on file  Tobacco Use  . Smoking status: Never Smoker  . Smokeless tobacco: Never Used  Vaping Use  . Vaping Use: Never used  Substance and Sexual Activity  . Alcohol use: Yes    Alcohol/week: 0.0 standard drinks    Comment: Rare  . Drug use: No  . Sexual activity: Never    Birth control/protection: Surgical    Comment: 1st intercourse 54 yo-Fewer than 5 partners-HYST  Other Topics Concern  . Not on file  Social History Narrative  . Not on file   Social Determinants of Health   Financial Resource Strain:   . Difficulty of Paying Living Expenses: Not on file  Food Insecurity:   . Worried About Programme researcher, broadcasting/film/video in the Last Year: Not on file  .  Ran Out of Food in the Last Year: Not on file  Transportation Needs:   . Lack of Transportation (Medical): Not on file  . Lack of Transportation (Non-Medical): Not on file  Physical Activity:   . Days of Exercise per Week: Not on file  . Minutes of Exercise per Session: Not on file  Stress:   . Feeling of Stress : Not on file  Social Connections:   . Frequency of Communication with Friends and Family: Not on file  . Frequency of Social Gatherings with Friends and Family: Not on file  . Attends Religious Services: Not on file  . Active Member of Clubs or Organizations: Not on file  . Attends Banker Meetings: Not on file  . Marital Status: Not on file     Family History: The patient's family history includes Breast cancer in an  other family member; Hypertension in her maternal grandfather; Lung cancer in her father; Multiple sclerosis in her father; Ovarian cancer in her maternal grandmother and mother; Rheum arthritis in her father; Spina bifida in her cousin; Stroke in her maternal grandfather; Uterine cancer in her maternal grandmother and mother. ROS:   Please see the history of present illness.    All 14 point review of systems negative except as described per history of present illness  EKGs/Labs/Other Studies Reviewed:      Recent Labs: No results found for requested labs within last 8760 hours.  Recent Lipid Panel    Component Value Date/Time   CHOL 212 (H) 11/25/2016 0910   TRIG 94 11/25/2016 0910   HDL 61 11/25/2016 0910   CHOLHDL 3.5 11/25/2016 0910   VLDL 19 11/25/2016 0910   LDLCALC 132 (H) 11/25/2016 0910    Physical Exam:    VS:  BP 120/80 (BP Location: Right Arm, Patient Position: Sitting, Cuff Size: Normal)   Pulse 71   Ht 5\' 6"  (1.676 m)   Wt 185 lb (83.9 kg)   LMP 10/27/2008   SpO2 98%   BMI 29.86 kg/m     Wt Readings from Last 3 Encounters:  01/24/20 185 lb (83.9 kg)  12/21/19 181 lb (82.1 kg)  07/10/19 186 lb 12.8 oz (84.7 kg)     GEN:  Well nourished, well developed in no acute distress HEENT: Normal NECK: No JVD; No carotid bruits LYMPHATICS: No lymphadenopathy CARDIAC: RRR, no murmurs, no rubs, no gallops RESPIRATORY:  Clear to auscultation without rales, wheezing or rhonchi  ABDOMEN: Soft, non-tender, non-distended MUSCULOSKELETAL:  No edema; No deformity  SKIN: Warm and dry LOWER EXTREMITIES: no swelling NEUROLOGIC:  Alert and oriented x 3 PSYCHIATRIC:  Normal affect   ASSESSMENT:    1. Hashimoto's thyroiditis   2. Atypical chest pain   3. Essential hypertension    PLAN:    In order of problems listed above:  1. Hashimoto thyroiditis: That being followed by endocrinology. 2. Atypical chest pain: Very rare episodes before we were talking about doing  calcium score but for some reason this was not scheduled.  I will ask her to have calcium score done.  That will help 09/09/19 to assess her risk and eventually start therapy if needed. 3. Essential hypertension blood pressure seems to be perfectly controlled continue present management however she has been she can stop Lasix I told her to do so and see what her blood pressure will do. 4. Cholesterol status unknown we will recheck her fasting lipid profile.   Medication Adjustments/Labs and Tests Ordered: Current medicines are  reviewed at length with the patient today.  Concerns regarding medicines are outlined above.  No orders of the defined types were placed in this encounter.  Medication changes: No orders of the defined types were placed in this encounter.   Signed, Georgeanna Lea, MD, [facility] 01/24/2020 4:52 PM    [facility]

## 2020-02-13 ENCOUNTER — Ambulatory Visit (INDEPENDENT_AMBULATORY_CARE_PROVIDER_SITE_OTHER)
Admission: RE | Admit: 2020-02-13 | Discharge: 2020-02-13 | Disposition: A | Discharge status: Home / Self Care | Payer: Self-pay | Source: Ambulatory Visit | Attending: Cardiology | Admitting: Cardiology

## 2020-02-13 ENCOUNTER — Other Ambulatory Visit: Payer: Self-pay

## 2020-02-13 DIAGNOSIS — R0789 Other chest pain: Secondary | ICD-10-CM

## 2020-02-13 DIAGNOSIS — E063 Autoimmune thyroiditis: Secondary | ICD-10-CM

## 2020-02-13 DIAGNOSIS — I10 Essential (primary) hypertension: Secondary | ICD-10-CM

## 2020-02-14 ENCOUNTER — Inpatient Hospital Stay: Admission: RE | Admit: 2020-02-14 | Payer: Commercial Managed Care - PPO | Source: Ambulatory Visit

## 2020-06-07 ENCOUNTER — Other Ambulatory Visit: Payer: Self-pay | Admitting: Cardiology

## 2020-07-24 ENCOUNTER — Ambulatory Visit: Payer: Commercial Managed Care - PPO | Admitting: Cardiology

## 2020-08-08 ENCOUNTER — Ambulatory Visit: Payer: Commercial Managed Care - PPO | Admitting: Cardiology

## 2020-12-11 ENCOUNTER — Other Ambulatory Visit: Payer: Self-pay

## 2020-12-12 ENCOUNTER — Encounter: Payer: Self-pay | Admitting: Cardiology

## 2020-12-12 ENCOUNTER — Ambulatory Visit: Payer: Commercial Managed Care - PPO | Admitting: Cardiology

## 2020-12-12 ENCOUNTER — Other Ambulatory Visit: Payer: Self-pay

## 2020-12-12 VITALS — BP 124/72 | HR 74 | Ht 66.0 in | Wt 178.4 lb

## 2020-12-12 DIAGNOSIS — R1310 Dysphagia, unspecified: Secondary | ICD-10-CM | POA: Diagnosis not present

## 2020-12-12 DIAGNOSIS — I10 Essential (primary) hypertension: Secondary | ICD-10-CM | POA: Diagnosis not present

## 2020-12-12 DIAGNOSIS — E063 Autoimmune thyroiditis: Secondary | ICD-10-CM | POA: Diagnosis not present

## 2020-12-12 DIAGNOSIS — Z13220 Encounter for screening for lipoid disorders: Secondary | ICD-10-CM

## 2020-12-12 DIAGNOSIS — R0789 Other chest pain: Secondary | ICD-10-CM

## 2020-12-12 NOTE — Patient Instructions (Signed)
Medication Instructions:  Your physician recommends that you continue on your current medications as directed. Please refer to the Current Medication list given to you today.  *If you need a refill on your cardiac medications before your next appointment, please call your pharmacy*   Lab Work: Your physician recommends that you return for lab work in: the next few days for lipids. You need to have labs done when you are fasting.  You can come Monday through Friday 8:30 am to 12:00 pm and 1:15 to 4:30. You do not need to make an appointment as the order has already been placed.   If you have labs (blood work) drawn today and your tests are completely normal, you will receive your results only by: MyChart Message (if you have MyChart) OR A paper copy in the mail If you have any lab test that is abnormal or we need to change your treatment, we will call you to review the results.   Testing/Procedures: None ordered   Follow-Up: At [facility], you and your health needs are our priority.  As part of our continuing mission to provide you with exceptional heart care, we have created designated Provider Care Teams.  These Care Teams include your primary Cardiologist (physician) and Advanced Practice Providers (APPs -  Physician Assistants and Nurse Practitioners) who all work together to provide you with the care you need, when you need it.  We recommend signing up for the patient portal called "MyChart".  Sign up information is provided on this After Visit Summary.  MyChart is used to connect with patients for Virtual Visits (Telemedicine).  Patients are able to view lab/test results, encounter notes, upcoming appointments, etc.  Non-urgent messages can be sent to your provider as well.   To learn more about what you can do with MyChart, go to [email or web address].    Your next appointment:   12 month(s)  The format for your next appointment:   In Person  Provider:   Gypsy Balsam,  MD   Other Instructions NA

## 2020-12-12 NOTE — Progress Notes (Signed)
Cardiology Office Note:    Date:  12/12/2020   ID:  Ashley Arellano, DOB 1965-07-08, MRN [id number]  PCP:  Catha Gosselin, MD  Cardiologist:  Gypsy Balsam, MD    Referring MD: Catha Gosselin, MD   Chief Complaint  Patient presents with   Follow-up  I am doing much better  History of Present Illness:    Ashley Arellano is a 55 y.o. female with past medical history significant for essential hypertension, atypical chest pain history of endometriosis, Hashimoto's.  She did have cardiac evaluation done before which included coronary calcium score which was 0.  However new development happen apparently she was diagnosed to have allergic reaction to multiple type of food now she is living on very restricted diet basically meat and that make her feel much better she does not have many of the symptomatology that she suffered from before including multiple joint and muscle aches.  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   Cetirizine HCl (ZYRTEC PO) Take 1 tablet  by mouth as needed (Allergies).   diphenhydrAMINE (BENADRYL) 25 MG tablet Take 25 mg by mouth every 6 (six) hours as needed for itching or allergies.   EPINEPHrine 0.3 mg/0.3 mL IJ SOAJ injection Inject 0.3 mg into the muscle as needed for anaphylaxis.   furosemide (LASIX) 20 MG tablet Take 20 mg by mouth daily.   Naproxen Sodium (ALEVE PO) Take 1 tablet by mouth as needed (pain).   ZOLMitriptan (ZOMIG) 2.5 MG tablet Take 2.5 mg by mouth once. May repeat in 2 hours if headache persists or recurs.     Allergies:   Sunflower oil and Penicillins   Social History   Socioeconomic History   Marital status: Divorced    Spouse name: Not on file   Number of children: Not on file   Years of education: Not on file   Highest education level: Not on file  Occupational History   Not on file  Tobacco Use   Smoking status: Never   Smokeless tobacco: Never  Vaping Use   Vaping Use: Never used  Substance and Sexual Activity   Alcohol use: Yes    Alcohol/week: 0.0 standard drinks    Comment: Rare   Drug use: No   Sexual activity: Never    Birth control/protection: Surgical    Comment: 1st intercourse 55 yo-Fewer than 5 partners-HYST  Other Topics Concern   Not on file  Social History Narrative   Not  on file   Social Determinants of Health   Financial Resource Strain: Not on file  Food Insecurity: Not on file  Transportation Needs: Not on file  Physical Activity: Not on file  Stress: Not on file  Social Connections: Not on file     Family History: The patient's family history includes Breast cancer in an other family member; Hypertension in her maternal grandfather; Lung cancer in her father; Multiple sclerosis in her father; Ovarian cancer in her maternal grandmother and mother; Rheum arthritis in her father; Spina bifida in her cousin; Stroke in her maternal grandfather; Uterine cancer in her maternal grandmother and mother. ROS:   Please see the history of present illness.    All  14 point review of systems negative except as described per history of present illness  EKGs/Labs/Other Studies Reviewed:      Recent Labs: No results found for requested labs within last 8760 hours.  Recent Lipid Panel    Component Value Date/Time   CHOL 212 (H) 11/25/2016 0910   TRIG 94 11/25/2016 0910   HDL 61 11/25/2016 0910   CHOLHDL 3.5 11/25/2016 0910   VLDL 19 11/25/2016 0910   LDLCALC 132 (H) 11/25/2016 0910    Physical Exam:    VS:  BP 124/72 (BP Location: Left Arm, Patient Position: Sitting)   Pulse 74   Ht 5\' 6"  (1.676 m)   Wt 178 lb 6.4 oz (80.9 kg)   LMP 10/27/2008   SpO2 96%   BMI 28.79 kg/m     Wt Readings from Last 3 Encounters:  12/12/20 178 lb 6.4 oz (80.9 kg)  01/24/20 185 lb (83.9 kg)  12/21/19 181 lb (82.1 kg)     GEN:  Well nourished, well developed in no acute distress HEENT: Normal NECK: No JVD; No carotid bruits LYMPHATICS: No lymphadenopathy CARDIAC: RRR, no murmurs, no rubs, no gallops RESPIRATORY:  Clear to auscultation without rales, wheezing or rhonchi  ABDOMEN: Soft, non-tender, non-distended MUSCULOSKELETAL:  No edema; No deformity  SKIN: Warm and dry LOWER EXTREMITIES: no swelling NEUROLOGIC:  Alert and oriented x 3 PSYCHIATRIC:  Normal affect   ASSESSMENT:    1. Screening cholesterol level   2. Essential hypertension   3. Dysphagia, unspecified type   4. Hashimoto's thyroiditis   5. Atypical chest pain    PLAN:    In order of problems listed above:  Essential hypertension blood pressure well controlled continue present management. Dysphagia improved significantly Hashimoto under control Atypical chest pain denies having any, calcium score before 0 Allergy to multiple food products, she is on restriction of diet and feeling dramatically better.   Medication Adjustments/Labs and Tests Ordered: Current medicines are reviewed at length with the patient today.  Concerns regarding medicines are outlined above.  Orders  Placed This Encounter  Procedures   Lipid panel   Medication changes: No orders of the defined types were placed in this encounter.   Signed, 12/23/19, MD, [facility] 12/12/2020 4:39 PM    [facility]

## 2020-12-22 ENCOUNTER — Ambulatory Visit: Payer: Commercial Managed Care - PPO | Admitting: Internal Medicine

## 2021-05-12 ENCOUNTER — Other Ambulatory Visit: Payer: Self-pay | Admitting: Cardiology

## 2022-01-20 ENCOUNTER — Ambulatory Visit: Payer: Commercial Managed Care - PPO | Attending: Cardiology | Admitting: Cardiology

## 2022-01-20 ENCOUNTER — Encounter: Payer: Self-pay | Admitting: Cardiology

## 2022-01-20 VITALS — BP 112/82 | HR 75 | Ht 66.0 in | Wt 184.0 lb

## 2022-01-20 DIAGNOSIS — E079 Disorder of thyroid, unspecified: Secondary | ICD-10-CM

## 2022-01-20 DIAGNOSIS — E785 Hyperlipidemia, unspecified: Secondary | ICD-10-CM

## 2022-01-20 DIAGNOSIS — R0789 Other chest pain: Secondary | ICD-10-CM

## 2022-01-20 DIAGNOSIS — I10 Essential (primary) hypertension: Secondary | ICD-10-CM | POA: Diagnosis not present

## 2022-01-20 LAB — LIPID PANEL
Chol/HDL Ratio: 3.4 ratio (ref 0.0–4.4)
Cholesterol, Total: 235 mg/dL — ABNORMAL HIGH (ref 100–199)
HDL: 69 mg/dL (ref >39)
LDL Chol Calc (NIH): 152 mg/dL — ABNORMAL HIGH (ref 0–99)
Triglycerides: 81 mg/dL (ref 0–149)
VLDL Cholesterol Cal: 14 mg/dL (ref 5–40)

## 2022-01-20 NOTE — Progress Notes (Signed)
Cardiology Office Note:    Date:  01/20/2022   ID:  Ashley Arellano, DOB Aug 27, 1965, MRN [id number]  PCP:  Hulan Fess, MD  Cardiologist:  Jenne Campus, MD    Referring MD: Hulan Fess, MD   Chief Complaint  Patient presents with   annual follow up  Doing well  History of Present Illness:    Ashley Arellano is a 56 y.o. female with past medical history significant for essential hypertension, atypical chest pain, coronary calcium score 0, Hashimoto's thyroiditis, endometriosis.  He is coming today 2 months for follow-up since the time I seen her last time she was diagnosed with prediabetes, she also have multiple food allergies.  Some illumination diet has been done with good results she is feeling much better.  Denies of any chest pain tightness squeezing pressure burning chest, she does not exercise on the regular basis but she is very active every single day she does between 8000 and up to 23,000 steps.  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   diphenhydrAMINE (BENADRYL) 25 MG tablet Take 25 mg by mouth every 6 (six) hours as needed for itching or  allergies.   EPINEPHrine 0.3 mg/0.3 mL IJ SOAJ injection Inject 0.3 mg into the muscle as needed for anaphylaxis.   Naproxen Sodium (ALEVE PO) Take 1 tablet by mouth as needed (pain).   ZOLMitriptan (ZOMIG) 2.5 MG tablet Take 2.5 mg by mouth once. May repeat in 2 hours if headache persists or recurs.   [DISCONTINUED] ALBUTEROL IN Inhale 2 puffs into the lungs as needed (SOB and Wheezing).   [DISCONTINUED] furosemide (LASIX) 20 MG tablet TAKE 1 TABLET BY MOUTH  DAILY (Patient taking differently: Take 20 mg by mouth daily.)     Allergies:   Sunflower oil and Penicillins   Social History   Socioeconomic History   Marital status: Divorced    Spouse name: Not on file   Number of children: Not on file   Years of education: Not on file   Highest education level: Not on file  Occupational History   Not on file  Tobacco Use   Smoking status: Never   Smokeless tobacco: Never  Vaping Use   Vaping Use: Never used  Substance and Sexual Activity   Alcohol use: Yes    Alcohol/week: 0.0 standard drinks of alcohol    Comment: Rare   Drug use: No   Sexual activity: Never    Birth control/protection: Surgical    Comment: 1st intercourse 56 yo-Fewer than 5 partners-HYST  Other Topics Concern   Not on file  Social History  Narrative   Not on file   Social Determinants of Health   Financial Resource Strain: Not on file  Food Insecurity: Not on file  Transportation Needs: Not on file  Physical Activity: Not on file  Stress: Not on file  Social Connections: Not on file     Family History: The patient's family history includes Breast cancer in an other family member; Hypertension in her maternal grandfather; Lung cancer in her father; Multiple sclerosis in her father; Ovarian cancer in her maternal grandmother and mother; Rheum arthritis in her father; Spina bifida in her cousin; Stroke in her maternal grandfather; Uterine cancer in her maternal grandmother and mother. ROS:   Please see the  history of present illness.    All 14 point review of systems negative except as described per history of present illness  EKGs/Labs/Other Studies Reviewed:      Recent Labs: No results found for requested labs within last 365 days.  Recent Lipid Panel    Component Value Date/Time   CHOL 212 (H) 11/25/2016 0910   TRIG 94 11/25/2016 0910   HDL 61 11/25/2016 0910   CHOLHDL 3.5 11/25/2016 0910   VLDL 19 11/25/2016 0910   LDLCALC 132 (H) 11/25/2016 0910    Physical Exam:    VS:  BP 112/82 (BP Location: Left Arm, Patient Position: Sitting)   Pulse 75   Ht 5\' 6"  (1.676 m)   Wt 184 lb (83.5 kg)   LMP 10/27/2008   SpO2 98%   BMI 29.70 kg/m     Wt Readings from Last 3 Encounters:  01/20/22 184 lb (83.5 kg)  12/12/20 178 lb 6.4 oz (80.9 kg)  01/24/20 185 lb (83.9 kg)     GEN:  Well nourished, well developed in no acute distress HEENT: Normal NECK: No JVD; No carotid bruits LYMPHATICS: No lymphadenopathy CARDIAC: RRR, no murmurs, no rubs, no gallops RESPIRATORY:  Clear to auscultation without rales, wheezing or rhonchi  ABDOMEN: Soft, non-tender, non-distended MUSCULOSKELETAL:  No edema; No deformity  SKIN: Warm and dry LOWER EXTREMITIES: no swelling NEUROLOGIC:  Alert and oriented x 3 PSYCHIATRIC:  Normal affect   ASSESSMENT:    1. Essential hypertension   2. Thyroid disease   3. Atypical chest pain    PLAN:    In order of problems listed above:  Essential hypertension: Blood pressure well controlled continue present management. Thyroid disease stable followed by endocrinology Atypical chest pain: Denies having any.  Calcium score 0 Dyslipidemia I will ask her to have fasting lipid profile done last cholesterol was elevated but not to the point of treatment with medications needed Prediabetes is a new diagnosis we discussed the mechanism of it I strongly advised to be more active and also avoid carbohydrates which she already does   Medication Adjustments/Labs  and Tests Ordered: Current medicines are reviewed at length with the patient today.  Concerns regarding medicines are outlined above.  No orders of the defined types were placed in this encounter.  Medication changes: No orders of the defined types were placed in this encounter.   Signed, 01/26/20, MD, [facility] 01/20/2022 9:29 AM    [facility]

## 2022-01-20 NOTE — Patient Instructions (Signed)

## 2022-01-20 NOTE — Addendum Note (Signed)
Addended by: Jacobo Forest D on: 01/20/2022 09:36 AM   Modules accepted: Orders

## 2022-01-26 ENCOUNTER — Telehealth: Payer: Self-pay

## 2022-01-26 NOTE — Telephone Encounter (Signed)
Sent My Chart message regarding Cholesterol results. Routed to PCP.

## 2022-04-16 ENCOUNTER — Other Ambulatory Visit: Payer: Self-pay | Admitting: Cardiology

## 2022-04-16 NOTE — Telephone Encounter (Signed)
Lasix refill denied, per chart patient no longer on medication

## 2022-10-03 IMAGING — CT CT CARDIAC CORONARY ARTERY CALCIUM SCORE
3 series · 14 of 20 positions shown, 15 images · non-contrast
Comparison: None.
COMPARISON: None.

Addendum:
EXAM:
OVER-READ INTERPRETATION  CT CHEST

The following report is an over-read performed by radiologist Dr.
Joana Alexandra Morandi [REDACTED] on 02/13/2020. This
over-read does not include interpretation of cardiac or coronary
anatomy or pathology. The coronary calcium score interpretation by
the cardiologist is attached.
CLINICAL DATA: Risk stratification, Chest pain/anginal equiv, 10yr
CHD risk < 10%, not treadmill candidate
Coronary Calcium Score
TECHNIQUE: The patient was scanned on a Siemens Force scanner. Axial
non-contrast 3 mm slices were carried out through the heart. The
data set was analyzed on a dedicated work station and scored using
the Agatston method.

[Series 2: casc 3.0 bv41 2 bestdiast 68 % · axial · 0.32mm/px · z∈[-222,-159]mm · 4 of 35 slices shown, 5 images]
[im 7/35  vessel]
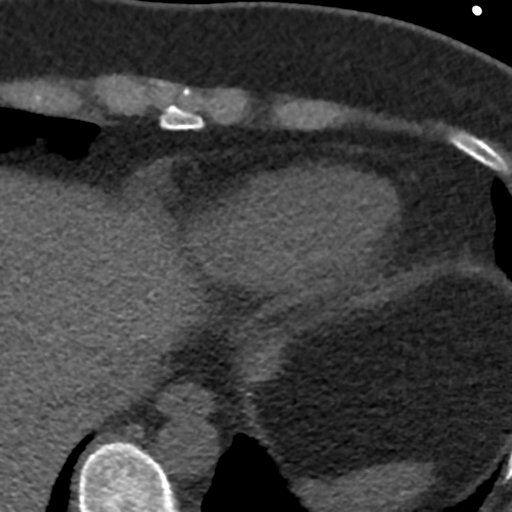
[im 7/35  lung]
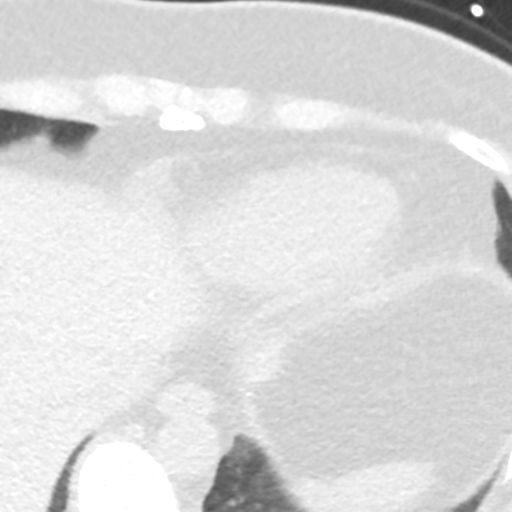
[im 14/35  vessel]
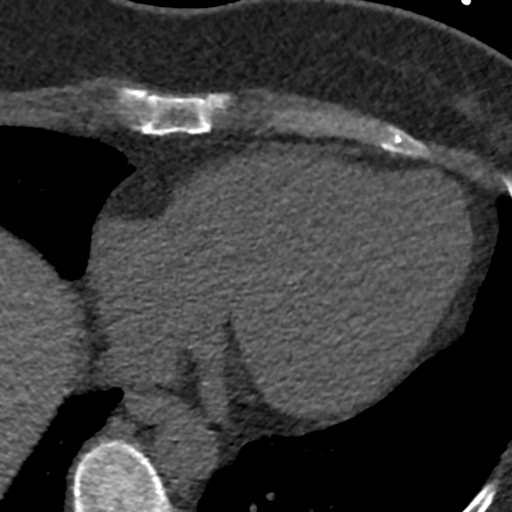
[im 21/35  vessel]
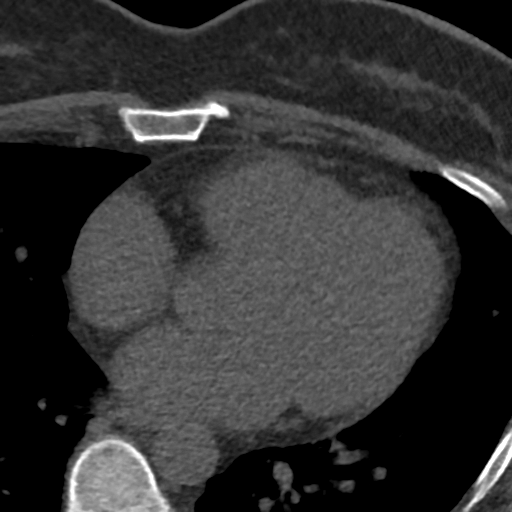
[im 28/35  vessel]
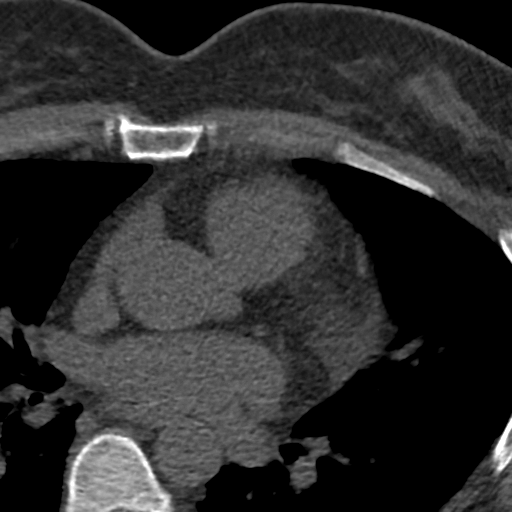

[Series 3: lung 72 % · axial · 0.62mm/px · z∈[-225,-156]mm · 5 of 35 slices shown]
[im 6/35  lung]
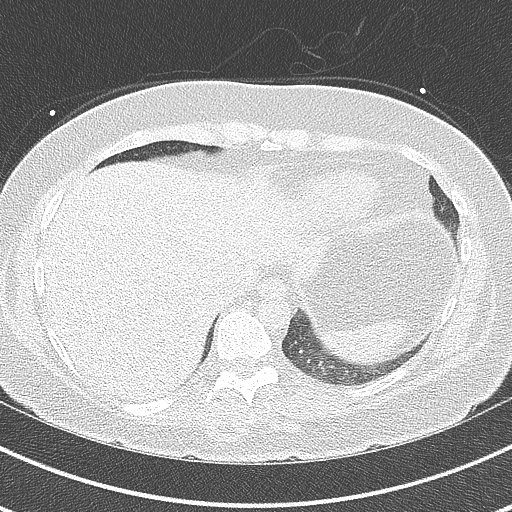
[im 12/35  lung]
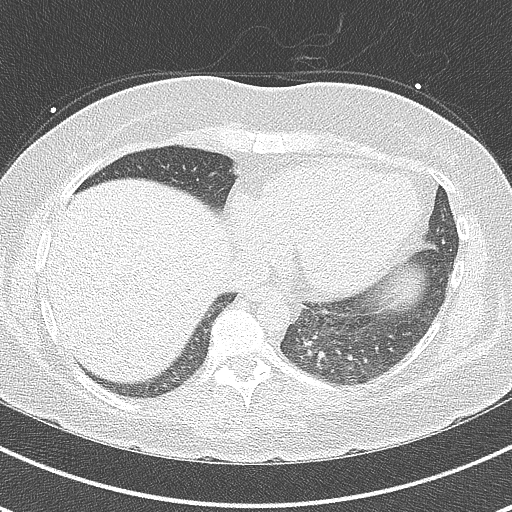
[im 18/35  lung]
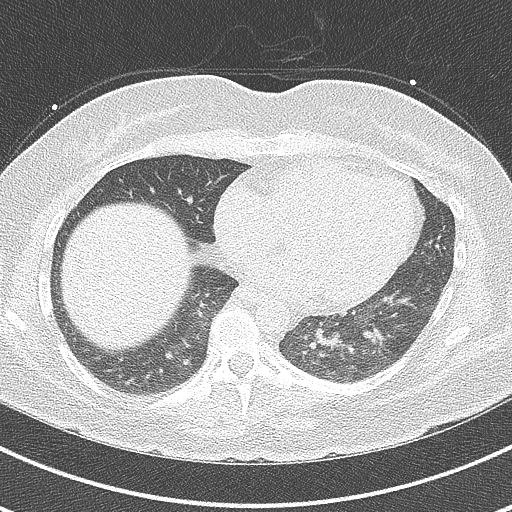
[im 23/35  lung]
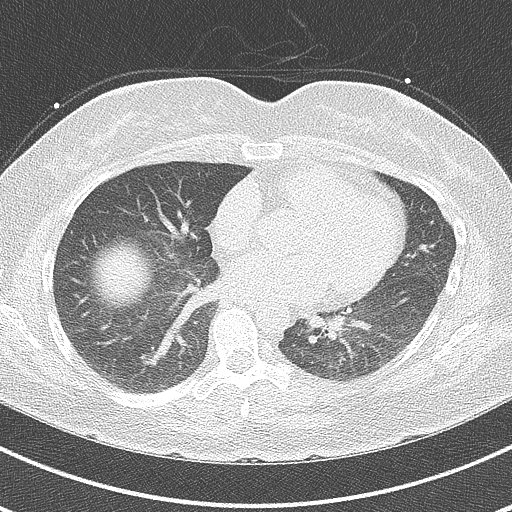
[im 29/35  lung]
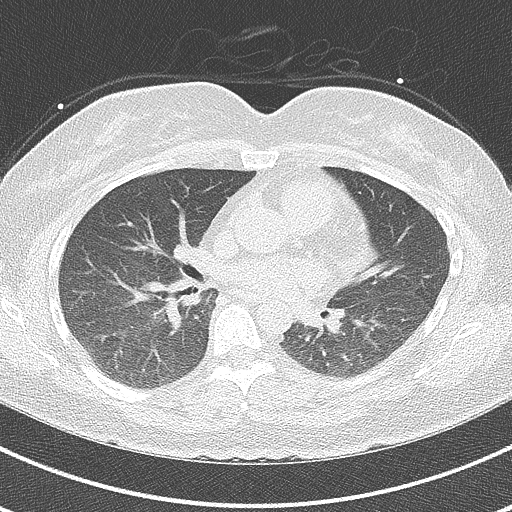

[Series 4: lung st 72 % · axial · 0.62mm/px · z∈[-225,-156]mm · 5 of 35 slices shown]
[im 6/35  lung]
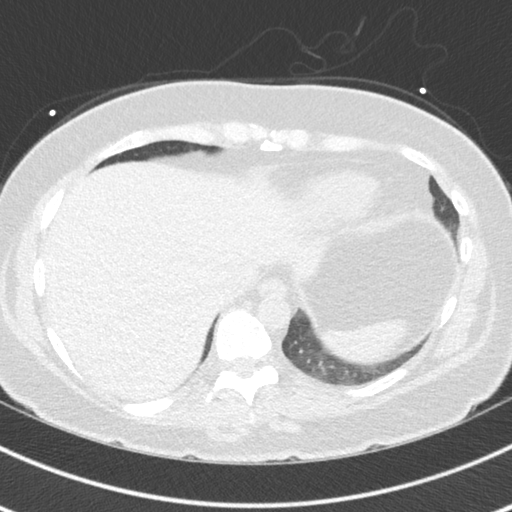
[im 12/35  lung]
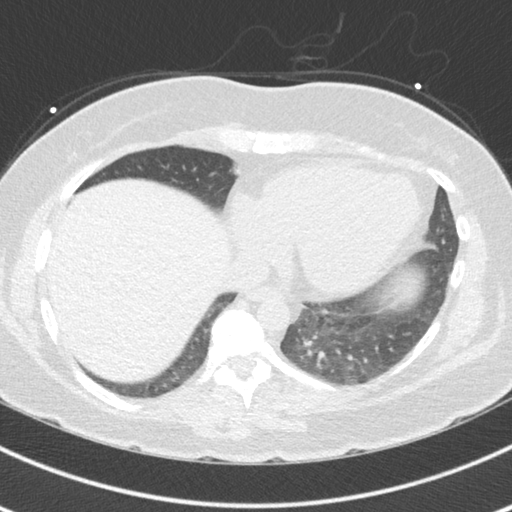
[im 18/35  lung]
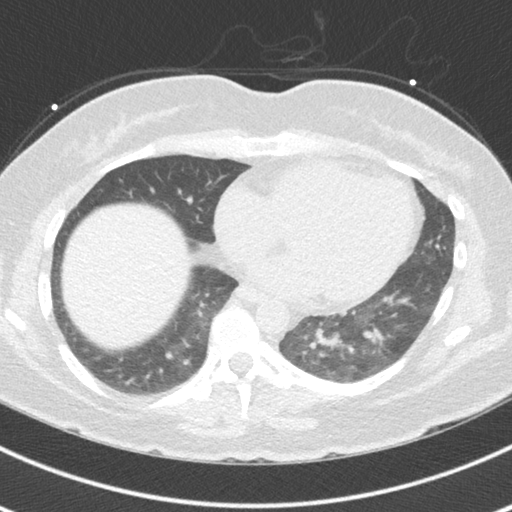
[im 23/35  lung]
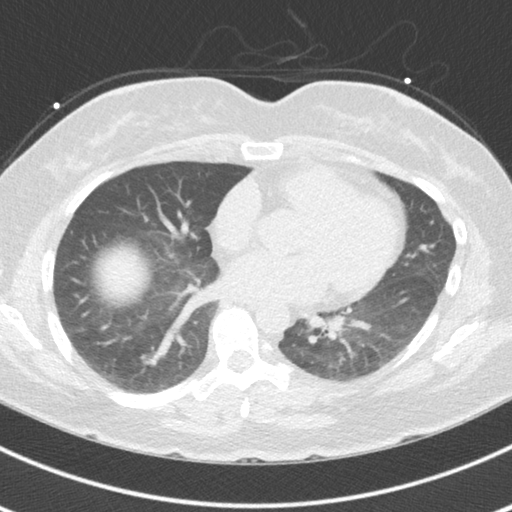
[im 29/35  lung]
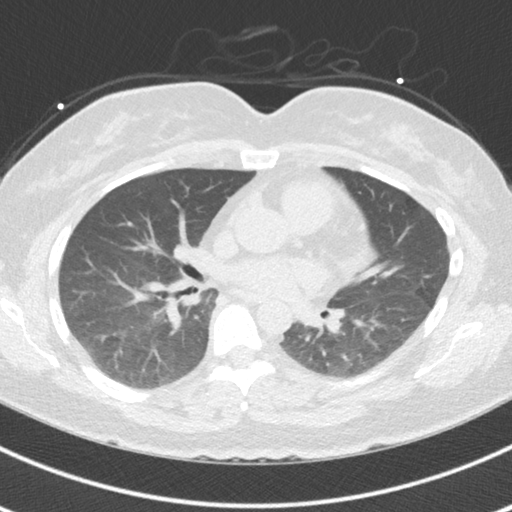

[14 of 20 positions shown; findings below may reference images not displayed]

FINDINGS: Tiny calcified granuloma in the lingula. Within the visualized
portions of the thorax there are no suspicious appearing pulmonary
nodules or masses, there is no acute consolidative airspace disease,
no pleural effusions, no pneumothorax and no lymphadenopathy.
Visualized portions of the upper abdomen are unremarkable. There are
no aggressive appearing lytic or blastic lesions noted in the
visualized portions of the skeleton.
IMPRESSION: No significant incidental noncardiac findings are noted.
FINDINGS: Non-cardiac: See separate report from [REDACTED].

Ascending Aorta: 32 mm at the mid ascending aorta measured in an
axial plane.

Pericardium: Normal

Coronary arteries:

Coronary calcium score of 0.
IMPRESSION: Coronary calcium score of 0.

*** End of Addendum ***
EXAM:
OVER-READ INTERPRETATION  CT CHEST

The following report is an over-read performed by radiologist Dr.
Joana Alexandra Morandi [REDACTED] on 02/13/2020. This
over-read does not include interpretation of cardiac or coronary
anatomy or pathology. The coronary calcium score interpretation by
the cardiologist is attached.
FINDINGS: Tiny calcified granuloma in the lingula. Within the visualized
portions of the thorax there are no suspicious appearing pulmonary
nodules or masses, there is no acute consolidative airspace disease,
no pleural effusions, no pneumothorax and no lymphadenopathy.
Visualized portions of the upper abdomen are unremarkable. There are
no aggressive appearing lytic or blastic lesions noted in the
visualized portions of the skeleton.
IMPRESSION: No significant incidental noncardiac findings are noted.
